# Patient Record
Sex: Female | Born: 1950 | Race: White | Hispanic: No | Marital: Married | Smoking: Former smoker
Health system: Southern US, Community
[De-identification: ages and names within clinical notes are randomized; demographics above are authoritative.]

## PROBLEM LIST (undated history)

## (undated) DIAGNOSIS — M069 Rheumatoid arthritis, unspecified: Secondary | ICD-10-CM

## (undated) DIAGNOSIS — M1990 Unspecified osteoarthritis, unspecified site: Secondary | ICD-10-CM

## (undated) DIAGNOSIS — E039 Hypothyroidism, unspecified: Secondary | ICD-10-CM

## (undated) DIAGNOSIS — I10 Essential (primary) hypertension: Secondary | ICD-10-CM

## (undated) DIAGNOSIS — E781 Pure hyperglyceridemia: Secondary | ICD-10-CM

## (undated) HISTORY — PX: OTHER SURGICAL HISTORY: SHX169

## (undated) HISTORY — PX: BREAST BIOPSY: SHX20

## (undated) HISTORY — PX: CHOLECYSTECTOMY: SHX55

---

## 1999-08-08 ENCOUNTER — Other Ambulatory Visit: Admission: RE | Admit: 1999-08-08 | Discharge: 1999-08-08 | Payer: Self-pay | Admitting: Gynecology

## 2000-07-07 ENCOUNTER — Encounter: Admission: RE | Admit: 2000-07-07 | Discharge: 2000-07-07 | Payer: Self-pay | Admitting: Gynecology

## 2000-07-07 ENCOUNTER — Encounter: Payer: Self-pay | Admitting: Gynecology

## 2000-07-21 ENCOUNTER — Other Ambulatory Visit: Admission: RE | Admit: 2000-07-21 | Discharge: 2000-07-21 | Payer: Self-pay | Admitting: Gynecology

## 2001-09-01 ENCOUNTER — Encounter: Payer: Self-pay | Admitting: Gynecology

## 2001-09-01 ENCOUNTER — Encounter: Admission: RE | Admit: 2001-09-01 | Discharge: 2001-09-01 | Payer: Self-pay | Admitting: Gynecology

## 2002-10-21 ENCOUNTER — Encounter: Payer: Self-pay | Admitting: Gynecology

## 2002-10-21 ENCOUNTER — Encounter: Admission: RE | Admit: 2002-10-21 | Discharge: 2002-10-21 | Payer: Self-pay | Admitting: Gynecology

## 2002-11-01 ENCOUNTER — Other Ambulatory Visit: Admission: RE | Admit: 2002-11-01 | Discharge: 2002-11-01 | Payer: Self-pay | Admitting: Gynecology

## 2003-12-19 ENCOUNTER — Encounter: Admission: RE | Admit: 2003-12-19 | Discharge: 2003-12-19 | Payer: Self-pay | Admitting: Gynecology

## 2003-12-20 ENCOUNTER — Other Ambulatory Visit: Admission: RE | Admit: 2003-12-20 | Discharge: 2003-12-20 | Payer: Self-pay | Admitting: Gynecology

## 2005-01-22 ENCOUNTER — Encounter: Admission: RE | Admit: 2005-01-22 | Discharge: 2005-01-22 | Payer: Self-pay | Admitting: Gynecology

## 2005-01-27 ENCOUNTER — Other Ambulatory Visit: Admission: RE | Admit: 2005-01-27 | Discharge: 2005-01-27 | Payer: Self-pay | Admitting: Gynecology

## 2006-03-13 ENCOUNTER — Encounter: Admission: RE | Admit: 2006-03-13 | Discharge: 2006-03-13 | Payer: Self-pay | Admitting: Gynecology

## 2006-03-25 ENCOUNTER — Other Ambulatory Visit: Admission: RE | Admit: 2006-03-25 | Discharge: 2006-03-25 | Payer: Self-pay | Admitting: Gynecology

## 2007-03-31 ENCOUNTER — Encounter: Admission: RE | Admit: 2007-03-31 | Discharge: 2007-03-31 | Payer: Self-pay | Admitting: Gynecology

## 2007-04-07 ENCOUNTER — Other Ambulatory Visit: Admission: RE | Admit: 2007-04-07 | Discharge: 2007-04-07 | Payer: Self-pay | Admitting: Gynecology

## 2008-04-28 ENCOUNTER — Encounter: Admission: RE | Admit: 2008-04-28 | Discharge: 2008-04-28 | Payer: Self-pay | Admitting: Gynecology

## 2009-05-24 ENCOUNTER — Encounter: Admission: RE | Admit: 2009-05-24 | Discharge: 2009-05-24 | Payer: Self-pay | Admitting: Gynecology

## 2009-06-11 ENCOUNTER — Emergency Department (HOSPITAL_BASED_OUTPATIENT_CLINIC_OR_DEPARTMENT_OTHER): Admission: EM | Admit: 2009-06-11 | Discharge: 2009-06-11 | Payer: Self-pay | Admitting: Emergency Medicine

## 2009-06-11 ENCOUNTER — Ambulatory Visit: Payer: Self-pay | Admitting: Diagnostic Radiology

## 2009-06-17 ENCOUNTER — Emergency Department (HOSPITAL_COMMUNITY): Admission: EM | Admit: 2009-06-17 | Discharge: 2009-06-17 | Payer: Self-pay | Admitting: Emergency Medicine

## 2009-07-18 ENCOUNTER — Ambulatory Visit (HOSPITAL_COMMUNITY): Admission: RE | Admit: 2009-07-18 | Discharge: 2009-07-18 | Payer: Self-pay | Admitting: General Surgery

## 2010-09-13 ENCOUNTER — Encounter: Admission: RE | Admit: 2010-09-13 | Discharge: 2010-09-13 | Payer: Self-pay | Admitting: Gynecology

## 2010-09-25 ENCOUNTER — Encounter
Admission: RE | Admit: 2010-09-25 | Discharge: 2010-09-25 | Payer: Self-pay | Source: Home / Self Care | Attending: Gynecology | Admitting: Gynecology

## 2010-09-27 ENCOUNTER — Encounter
Admission: RE | Admit: 2010-09-27 | Discharge: 2010-09-27 | Payer: Self-pay | Source: Home / Self Care | Attending: Gynecology | Admitting: Gynecology

## 2010-10-22 HISTORY — PX: BREAST BIOPSY: SHX20

## 2011-01-17 LAB — DIFFERENTIAL
Basophils Relative: 0 % (ref 0–1)
Eosinophils Absolute: 0 10*3/uL (ref 0.0–0.7)
Lymphocytes Relative: 18 % (ref 12–46)
Lymphs Abs: 1.2 10*3/uL (ref 0.7–4.0)
Monocytes Absolute: 0.4 10*3/uL (ref 0.1–1.0)
Monocytes Relative: 6 % (ref 3–12)
Neutro Abs: 5.3 10*3/uL (ref 1.7–7.7)

## 2011-01-17 LAB — URINE MICROSCOPIC-ADD ON

## 2011-01-17 LAB — URINALYSIS, ROUTINE W REFLEX MICROSCOPIC
Ketones, ur: NEGATIVE mg/dL
Nitrite: NEGATIVE
Protein, ur: NEGATIVE mg/dL
Urobilinogen, UA: 1 mg/dL (ref 0.0–1.0)

## 2011-01-17 LAB — COMPREHENSIVE METABOLIC PANEL
ALT: 429 U/L — ABNORMAL HIGH (ref 0–35)
BUN: 12 mg/dL (ref 6–23)
Calcium: 9.3 mg/dL (ref 8.4–10.5)
Creatinine, Ser: 0.77 mg/dL (ref 0.4–1.2)
GFR calc non Af Amer: >60 mL/min (ref >60)
Total Bilirubin: 2.3 mg/dL — ABNORMAL HIGH (ref 0.3–1.2)

## 2011-01-17 LAB — LIPASE, BLOOD: Lipase: 35 U/L (ref 11–59)

## 2011-01-17 LAB — CBC
HCT: 41.8 % (ref 36.0–46.0)
Hemoglobin: 14.3 g/dL (ref 12.0–15.0)
Platelets: 224 10*3/uL (ref 150–400)
RDW: 13.6 % (ref 11.5–15.5)
WBC: 7.1 10*3/uL (ref 4.0–10.5)

## 2011-01-18 LAB — POCT CARDIAC MARKERS
CKMB, poc: 1.1 ng/mL (ref 1.0–8.0)
Troponin i, poc: <0.05 ng/mL (ref 0.00–0.09)

## 2011-01-18 LAB — BASIC METABOLIC PANEL
Creatinine, Ser: 0.8 mg/dL (ref 0.4–1.2)
GFR calc non Af Amer: >60 mL/min (ref >60)

## 2011-01-18 LAB — CBC
HCT: 38.1 % (ref 36.0–46.0)
Hemoglobin: 13.4 g/dL (ref 12.0–15.0)
MCHC: 35 g/dL (ref 30.0–36.0)
Platelets: 232 10*3/uL (ref 150–400)
RDW: 12.8 % (ref 11.5–15.5)

## 2011-01-18 LAB — DIFFERENTIAL
Eosinophils Relative: 2 % (ref 0–5)
Monocytes Relative: 6 % (ref 3–12)
Neutro Abs: 4.2 10*3/uL (ref 1.7–7.7)
Neutrophils Relative %: 65 % (ref 43–77)

## 2011-08-27 ENCOUNTER — Other Ambulatory Visit: Payer: Self-pay | Admitting: Gynecology

## 2011-08-27 DIAGNOSIS — R921 Mammographic calcification found on diagnostic imaging of breast: Secondary | ICD-10-CM

## 2011-09-15 ENCOUNTER — Ambulatory Visit
Admission: RE | Admit: 2011-09-15 | Discharge: 2011-09-15 | Disposition: A | Discharge status: Home / Self Care | Payer: Commercial Indemnity | Source: Ambulatory Visit | Attending: Gynecology | Admitting: Gynecology

## 2011-09-15 DIAGNOSIS — R921 Mammographic calcification found on diagnostic imaging of breast: Secondary | ICD-10-CM

## 2011-10-02 ENCOUNTER — Other Ambulatory Visit: Payer: Self-pay | Admitting: Gynecology

## 2011-11-30 ENCOUNTER — Emergency Department (HOSPITAL_COMMUNITY): Payer: Commercial Indemnity

## 2011-11-30 ENCOUNTER — Encounter (HOSPITAL_COMMUNITY): Payer: Self-pay

## 2011-11-30 ENCOUNTER — Emergency Department (HOSPITAL_COMMUNITY)
Admission: EM | Admit: 2011-11-30 | Discharge: 2011-11-30 | Disposition: A | Discharge status: Home / Self Care | Payer: Commercial Indemnity | Attending: Emergency Medicine | Admitting: Emergency Medicine

## 2011-11-30 ENCOUNTER — Other Ambulatory Visit: Payer: Self-pay

## 2011-11-30 DIAGNOSIS — R110 Nausea: Secondary | ICD-10-CM | POA: Insufficient documentation

## 2011-11-30 DIAGNOSIS — R109 Unspecified abdominal pain: Secondary | ICD-10-CM | POA: Insufficient documentation

## 2011-11-30 DIAGNOSIS — M79609 Pain in unspecified limb: Secondary | ICD-10-CM | POA: Insufficient documentation

## 2011-11-30 DIAGNOSIS — R079 Chest pain, unspecified: Secondary | ICD-10-CM | POA: Insufficient documentation

## 2011-11-30 DIAGNOSIS — I10 Essential (primary) hypertension: Secondary | ICD-10-CM | POA: Insufficient documentation

## 2011-11-30 HISTORY — DX: Unspecified osteoarthritis, unspecified site: M19.90

## 2011-11-30 HISTORY — DX: Pure hyperglyceridemia: E78.1

## 2011-11-30 HISTORY — DX: Essential (primary) hypertension: I10

## 2011-11-30 LAB — DIFFERENTIAL
Basophils Relative: 0 % (ref 0–1)
Eosinophils Absolute: 0.1 10*3/uL (ref 0.0–0.7)
Eosinophils Relative: 2 % (ref 0–5)
Lymphs Abs: 1.6 10*3/uL (ref 0.7–4.0)
Neutrophils Relative %: 73 % (ref 43–77)

## 2011-11-30 LAB — CARDIAC PANEL(CRET KIN+CKTOT+MB+TROPI): CK, MB: 2.5 ng/mL (ref 0.3–4.0)

## 2011-11-30 LAB — BASIC METABOLIC PANEL
Calcium: 9.3 mg/dL (ref 8.4–10.5)
GFR calc Af Amer: >90 mL/min (ref >90)
GFR calc non Af Amer: 89 mL/min — ABNORMAL LOW (ref >90)
Glucose, Bld: 107 mg/dL — ABNORMAL HIGH (ref 70–99)
Sodium: 141 mEq/L (ref 135–145)

## 2011-11-30 LAB — LIPASE, BLOOD: Lipase: 56 U/L (ref 11–59)

## 2011-11-30 LAB — CBC
MCH: 29.6 pg (ref 26.0–34.0)
MCHC: 33.9 g/dL (ref 30.0–36.0)
MCV: 87.3 fL (ref 78.0–100.0)
Platelets: 197 10*3/uL (ref 150–400)
RBC: 4.02 MIL/uL (ref 3.87–5.11)
RDW: 13.5 % (ref 11.5–15.5)

## 2011-11-30 LAB — HEPATIC FUNCTION PANEL
AST: 24 U/L (ref 0–37)
Albumin: 3.3 g/dL — ABNORMAL LOW (ref 3.5–5.2)
Alkaline Phosphatase: 84 U/L (ref 39–117)
Bilirubin, Direct: <0.1 mg/dL (ref 0.0–0.3)
Total Bilirubin: 0.4 mg/dL (ref 0.3–1.2)

## 2011-11-30 MED ORDER — POTASSIUM CHLORIDE CRYS ER 20 MEQ PO TBCR
40.0000 meq | EXTENDED_RELEASE_TABLET | Freq: Once | ORAL | Status: AC
  Administered 2011-11-30: 40 meq via ORAL
  Filled 2011-11-30: qty 2

## 2011-11-30 MED ORDER — ASPIRIN 81 MG PO CHEW
324.0000 mg | CHEWABLE_TABLET | Freq: Once | ORAL | Status: AC
  Administered 2011-11-30: 324 mg via ORAL
  Filled 2011-11-30: qty 4

## 2011-11-30 NOTE — ED Provider Notes (Addendum)
History     CSN: [id number]  Arrival date & time 11/30/11  0701   First MD Initiated Contact with Patient 11/30/11 [phone]      Chief Complaint  Patient presents with  . Abdominal Pain    (Consider location/radiation/quality/duration/timing/severity/associated sxs/prior treatment) Patient is a 61 y.o. female presenting with chest pain. The history is provided by the patient.  Chest Pain The chest pain began 1 - 2 hours ago. Duration of episode(s) is 20 minutes. Chest pain occurs constantly. The chest pain is resolved. Associated with: No associated factors, onset at rest. The severity of the pain is moderate. The quality of the pain is described as dull and pressure-like (Localized to the bilateral lower anterior chest wall, radiating to the axillary). The pain does not radiate. Exacerbated by: Nothing. Primary symptoms include abdominal pain and nausea. Pertinent negatives for primary symptoms include no fever, no fatigue, no syncope, no shortness of breath, no cough, no wheezing, no palpitations, no vomiting, no dizziness and no altered mental status. Primary symptoms comment: The patient felt flushed  Pertinent negatives for associated symptoms include no claudication, no diaphoresis, no lower extremity edema, no near-syncope, no numbness, no orthopnea and no weakness. She tried nothing for the symptoms.  Her past medical history is significant for hyperlipidemia and hypertension.  Pertinent negatives for past medical history include no CAD, no CHF and no diabetes.     Past Medical History  Diagnosis Date  . Hypertension   . Arthritis   . High triglycerides     Past Surgical History  Procedure Date  . Cholecystectomy     History reviewed. No pertinent family history.  History  Substance Use Topics  . Smoking status: Former Smoker    Quit date: 11/30/2007  . Smokeless tobacco: Not on file  . Alcohol Use: No    OB History    Grav Para Term Preterm Abortions TAB SAB Ect  Mult Living                  Review of Systems  Constitutional: Negative for fever, diaphoresis and fatigue.  Respiratory: Negative for cough, shortness of breath and wheezing.   Cardiovascular: Positive for chest pain. Negative for palpitations, orthopnea, claudication, syncope and near-syncope.  Gastrointestinal: Positive for nausea and abdominal pain. Negative for vomiting.  Neurological: Negative for dizziness, weakness and numbness.  Psychiatric/Behavioral: Negative for altered mental status.  All other systems reviewed and are negative.    Allergies  Review of patient's allergies indicates no known allergies.  Home Medications   Current Outpatient Rx  Name Route Sig Dispense Refill  . ASPIRIN 81 MG PO CHEW Oral Chew 81 mg by mouth daily.    Marland Kitchen CALCIUM-VITAMIN D-VITAMIN K 500-100-40 MG-UNT-MCG PO CHEW Oral Chew 2 tablets by mouth daily.    Marland Kitchen VITAMIN D 1000 UNITS PO TABS Oral Take 1,000 Units by mouth daily.    . CYCLOSPORINE 0.05 % OP EMUL Both Eyes Place 1 drop into both eyes 2 (two) times daily.    Marland Kitchen EZETIMIBE-SIMVASTATIN 10-20 MG PO TABS Oral Take 1 tablet by mouth at bedtime.    Marland Kitchen FLUOROURACIL 1 % EX CREA Topical Apply 1 application topically 2 (two) times daily.    Marland Kitchen FOLIC ACID 1 MG PO TABS Oral Take 1 mg by mouth daily.    Marland Kitchen KRILL OIL OMEGA-3 300 MG PO CAPS Oral Take 1 capsule by mouth daily.    Marland Kitchen LEVOTHYROXINE SODIUM 137 MCG PO TABS Oral Take 137 mcg  by mouth daily.    Marland Kitchen LOSARTAN POTASSIUM-HCTZ 100-12.5 MG PO TABS Oral Take 1 tablet by mouth daily.    Marland Kitchen METHOTREXATE 2.5 MG PO TABS Oral Take 25 mg by mouth once a week. Caution:Chemotherapy. Protect from light. Takes on Wednesday    . OSTEO BI-FLEX ADV DOUBLE ST PO Oral Take 2 tablets by mouth daily.    . ADULT MULTIVITAMIN W/MINERALS CH Oral Take 1 tablet by mouth daily.    Marland Kitchen PHILLIPS COLON HEALTH PO Oral Take 1 tablet by mouth daily.    Marland Kitchen PSEUDOEPHEDRINE HCL ER 120 MG PO TB12 Oral Take 120 mg by mouth daily.      BP  136/71  Pulse 77  Temp(Src) 97.6 F (36.4 C) (Oral)  Resp 14  Ht 5' 4.75" (1.645 m)  Wt 148 lb (67.132 kg)  BMI 24.82 kg/m2  SpO2 100%  Physical Exam  Nursing note and vitals reviewed. Constitutional: She is oriented to person, place, and time. She appears well-developed and well-nourished. No distress.  HENT:  Head: Normocephalic and atraumatic.  Mouth/Throat: Oropharynx is clear and moist.  Eyes: EOM are normal. Pupils are equal, round, and reactive to light.  Neck: Normal range of motion. Neck supple. No JVD present. No tracheal deviation present.  Cardiovascular: Normal rate, regular rhythm, normal heart sounds and intact distal pulses.   No extrasystoles are present. Exam reveals no gallop, no S3, no S4 and no friction rub.   No murmur heard. Pulmonary/Chest: Breath sounds normal. No accessory muscle usage or stridor. Not tachypneic. No respiratory distress. She has no wheezes. She has no rales. She exhibits no tenderness.  Abdominal: Soft. Bowel sounds are normal. She exhibits no distension. There is no tenderness.  Musculoskeletal: Normal range of motion. She exhibits no edema and no tenderness.  Neurological: She is alert and oriented to person, place, and time. She has normal reflexes. No cranial nerve deficit. Coordination normal.  Skin: Skin is warm and dry. No rash noted. She is not diaphoretic. No erythema. No pallor.  Psychiatric: She has a normal mood and affect. Her behavior is normal. Judgment and thought content normal.    ED Course  Procedures (including critical care time)   Date: 11/30/2011  Rate: 80  Rhythm: normal sinus rhythm  QRS Axis: normal  Intervals: normal  ST/T Wave abnormalities: normal  Conduction Disutrbances:none  Narrative Interpretation: Normal EKG  Old EKG Reviewed: unchanged   Labs Reviewed  CBC - Abnormal; Notable for the following:    Hemoglobin 11.9 (*)    HCT 35.1 (*)    All other components within normal limits  BASIC  METABOLIC PANEL - Abnormal; Notable for the following:    Potassium 3.1 (*)    Glucose, Bld 107 (*)    GFR calc non Af Amer 89 (*)    All other components within normal limits  HEPATIC FUNCTION PANEL - Abnormal; Notable for the following:    Albumin 3.3 (*)    All other components within normal limits  DIFFERENTIAL  CARDIAC PANEL(CRET KIN+CKTOT+MB+TROPI)  LIPASE, BLOOD  TROPONIN I   Dg Chest 2 View  11/30/2011  *RADIOLOGY REPORT*  Clinical Data: Abdominal pain and chest pain  CHEST - 2 VIEW  Comparison: Chest radiograph 06/11/2009  Findings: Normal heart, mediastinal, and hilar contours.  Normal pulmonary vascularity.  The lungs are well expanded and clear. Right nipple shadow noted.  No acute bony abnormality. Cholecystectomy clips.  IMPRESSION: No acute cardiopulmonary disease.  Original Report Authenticated By: Britta Mccreedy,  M.D.     No diagnosis found.    MDM  ACS, MI, CAD, Musculoskeletal chest pain, costochondritis, GERD, Gastrointestinal Chest Pain, Pleuritic Chest Pain, Pneumonia, Pneumothorax, Pulmonary Embolism, Esophageal Spasm, Arrhythmia considered among other potential etiologies in the patient's differential diagnosis.  The patient's symptoms are atypical in that they started at rest and went away on their own without intervention, as well as the location of the symptoms. The patient describes her symptoms as feeling like pressure from the inside pushing this is also atypical, however as a woman she may have atypical symptoms with myocardial infarction. I think that this may more likely be diaphragmatic spasm, musculoskeletal chest wall pain, or gastrointestinal chest pain, not of a significant cause and now resolved, however I will rule the patient out for myocardial infarction in the emergency department with serial troponins.  12:02 PM Negative delta troponin, no further chest pain, I will discharge the patient home with atypical chest pain a followup with her primary  care physician.      Felisa Bonier, MD 11/30/11 0730  Felisa Bonier, MD 11/30/11 1478  Felisa Bonier, MD 11/30/11 [phone]

## 2011-11-30 NOTE — ED Notes (Signed)
Pt resting quietly. Denies pain. No signs of distress noted at the time. Vital signs stable.

## 2011-11-30 NOTE — ED Notes (Signed)
Pt is resting and denies any pain.

## 2011-11-30 NOTE — ED Notes (Signed)
Pt presents to department for evaluation of epigastric/chest pain radiating to back. Symptoms started this morning. Pt states she became sweaty when pain began, also states frequent burping. Nothing made pain better. Described as constant pressure sensation. Denies nausea/vomiting. No pain at the time. She is alert and oriented x4. No signs of distress noted at the time.

## 2011-11-30 NOTE — ED Notes (Signed)
Pt woke up about 1.5hrs ago with epigastric pain that radiates all the way around her. This resolved 20 mins after it started. #20 left hand. 12 lead per ems states that it was sinus.

## 2012-08-24 ENCOUNTER — Other Ambulatory Visit: Payer: Self-pay | Admitting: Gynecology

## 2012-08-24 DIAGNOSIS — Z1231 Encounter for screening mammogram for malignant neoplasm of breast: Secondary | ICD-10-CM

## 2012-10-01 ENCOUNTER — Ambulatory Visit
Admission: RE | Admit: 2012-10-01 | Discharge: 2012-10-01 | Disposition: A | Discharge status: Home / Self Care | Payer: Commercial Indemnity | Source: Ambulatory Visit | Attending: Gynecology | Admitting: Gynecology

## 2012-10-01 DIAGNOSIS — Z1231 Encounter for screening mammogram for malignant neoplasm of breast: Secondary | ICD-10-CM

## 2012-10-08 ENCOUNTER — Other Ambulatory Visit: Payer: Self-pay | Admitting: Gynecology

## 2013-08-31 ENCOUNTER — Other Ambulatory Visit: Payer: Self-pay

## 2013-08-31 DIAGNOSIS — Z1231 Encounter for screening mammogram for malignant neoplasm of breast: Secondary | ICD-10-CM

## 2013-10-05 ENCOUNTER — Ambulatory Visit: Payer: Commercial Indemnity

## 2013-10-14 ENCOUNTER — Ambulatory Visit
Admission: RE | Admit: 2013-10-14 | Discharge: 2013-10-14 | Disposition: A | Discharge status: Home / Self Care | Payer: BC Managed Care – PPO | Source: Ambulatory Visit

## 2013-10-14 DIAGNOSIS — Z1231 Encounter for screening mammogram for malignant neoplasm of breast: Secondary | ICD-10-CM

## 2014-02-22 ENCOUNTER — Other Ambulatory Visit: Payer: Self-pay | Admitting: Dermatology

## 2014-06-27 ENCOUNTER — Other Ambulatory Visit: Payer: Self-pay | Admitting: Dermatology

## 2014-09-18 ENCOUNTER — Other Ambulatory Visit: Payer: Self-pay

## 2014-09-18 DIAGNOSIS — Z1231 Encounter for screening mammogram for malignant neoplasm of breast: Secondary | ICD-10-CM

## 2014-10-18 ENCOUNTER — Ambulatory Visit
Admission: RE | Admit: 2014-10-18 | Discharge: 2014-10-18 | Disposition: A | Discharge status: Home / Self Care | Payer: Medicare Other | Source: Ambulatory Visit

## 2014-10-18 ENCOUNTER — Encounter (INDEPENDENT_AMBULATORY_CARE_PROVIDER_SITE_OTHER): Payer: Self-pay

## 2014-10-18 DIAGNOSIS — Z1231 Encounter for screening mammogram for malignant neoplasm of breast: Secondary | ICD-10-CM

## 2015-05-29 ENCOUNTER — Other Ambulatory Visit: Payer: Self-pay | Admitting: Family Medicine

## 2015-05-29 DIAGNOSIS — M5431 Sciatica, right side: Secondary | ICD-10-CM

## 2015-06-09 ENCOUNTER — Ambulatory Visit
Admission: RE | Admit: 2015-06-09 | Discharge: 2015-06-09 | Disposition: A | Discharge status: Home / Self Care | Payer: Medicare Other | Source: Ambulatory Visit | Attending: Family Medicine | Admitting: Family Medicine

## 2015-06-09 DIAGNOSIS — M5431 Sciatica, right side: Secondary | ICD-10-CM

## 2015-09-18 ENCOUNTER — Other Ambulatory Visit: Payer: Self-pay

## 2015-09-18 DIAGNOSIS — Z1231 Encounter for screening mammogram for malignant neoplasm of breast: Secondary | ICD-10-CM

## 2015-10-24 ENCOUNTER — Ambulatory Visit
Admission: RE | Admit: 2015-10-24 | Discharge: 2015-10-24 | Disposition: A | Discharge status: Home / Self Care | Payer: Medicare Other | Source: Ambulatory Visit

## 2015-10-24 DIAGNOSIS — Z1231 Encounter for screening mammogram for malignant neoplasm of breast: Secondary | ICD-10-CM

## 2015-10-25 ENCOUNTER — Other Ambulatory Visit: Payer: Self-pay | Admitting: Gynecology

## 2015-10-25 DIAGNOSIS — R928 Other abnormal and inconclusive findings on diagnostic imaging of breast: Secondary | ICD-10-CM

## 2015-10-30 ENCOUNTER — Ambulatory Visit
Admission: RE | Admit: 2015-10-30 | Discharge: 2015-10-30 | Disposition: A | Discharge status: Home / Self Care | Payer: Medicare Other | Source: Ambulatory Visit | Attending: Gynecology | Admitting: Gynecology

## 2015-10-30 DIAGNOSIS — R928 Other abnormal and inconclusive findings on diagnostic imaging of breast: Secondary | ICD-10-CM

## 2016-09-22 DIAGNOSIS — M5430 Sciatica, unspecified side: Secondary | ICD-10-CM | POA: Insufficient documentation

## 2016-09-22 DIAGNOSIS — I10 Essential (primary) hypertension: Secondary | ICD-10-CM | POA: Insufficient documentation

## 2016-09-22 DIAGNOSIS — E785 Hyperlipidemia, unspecified: Secondary | ICD-10-CM | POA: Insufficient documentation

## 2016-09-22 DIAGNOSIS — F5109 Other insomnia not due to a substance or known physiological condition: Secondary | ICD-10-CM | POA: Insufficient documentation

## 2016-09-22 DIAGNOSIS — M8580 Other specified disorders of bone density and structure, unspecified site: Secondary | ICD-10-CM | POA: Insufficient documentation

## 2016-09-22 DIAGNOSIS — R3129 Other microscopic hematuria: Secondary | ICD-10-CM | POA: Insufficient documentation

## 2016-09-22 DIAGNOSIS — R42 Dizziness and giddiness: Secondary | ICD-10-CM | POA: Insufficient documentation

## 2016-09-26 ENCOUNTER — Other Ambulatory Visit: Payer: Self-pay | Admitting: Gynecology

## 2016-09-26 DIAGNOSIS — Z1231 Encounter for screening mammogram for malignant neoplasm of breast: Secondary | ICD-10-CM

## 2016-09-28 DIAGNOSIS — E039 Hypothyroidism, unspecified: Secondary | ICD-10-CM | POA: Insufficient documentation

## 2016-10-17 ENCOUNTER — Other Ambulatory Visit: Payer: Self-pay | Admitting: Neurological Surgery

## 2016-11-07 ENCOUNTER — Ambulatory Visit
Admission: RE | Admit: 2016-11-07 | Discharge: 2016-11-07 | Disposition: A | Discharge status: Home / Self Care | Payer: Medicare Other | Source: Ambulatory Visit | Attending: Gynecology | Admitting: Gynecology

## 2016-11-07 DIAGNOSIS — Z1231 Encounter for screening mammogram for malignant neoplasm of breast: Secondary | ICD-10-CM

## 2016-11-13 ENCOUNTER — Encounter (HOSPITAL_COMMUNITY): Payer: Self-pay

## 2016-11-13 ENCOUNTER — Encounter (HOSPITAL_COMMUNITY)
Admission: RE | Admit: 2016-11-13 | Discharge: 2016-11-13 | Disposition: A | Discharge status: Home / Self Care | Payer: Medicare Other | Source: Ambulatory Visit | Attending: Neurological Surgery | Admitting: Neurological Surgery

## 2016-11-13 DIAGNOSIS — Z01810 Encounter for preprocedural cardiovascular examination: Secondary | ICD-10-CM | POA: Diagnosis present

## 2016-11-13 DIAGNOSIS — Z01812 Encounter for preprocedural laboratory examination: Secondary | ICD-10-CM | POA: Diagnosis present

## 2016-11-13 HISTORY — DX: Rheumatoid arthritis, unspecified: M06.9

## 2016-11-13 HISTORY — DX: Hypothyroidism, unspecified: E03.9

## 2016-11-13 LAB — BASIC METABOLIC PANEL
Anion gap: 11 (ref 5–15)
BUN: 12 mg/dL (ref 6–20)
CALCIUM: 9.8 mg/dL (ref 8.9–10.3)
CO2: 28 mmol/L (ref 22–32)
CREATININE: 0.97 mg/dL (ref 0.44–1.00)
Chloride: 101 mmol/L (ref 101–111)
GFR calc Af Amer: >60 mL/min (ref >60)
GFR, EST NON AFRICAN AMERICAN: 59 mL/min — AB (ref >60)
Glucose, Bld: 102 mg/dL — ABNORMAL HIGH (ref 65–99)
Potassium: 3.4 mmol/L — ABNORMAL LOW (ref 3.5–5.1)
SODIUM: 140 mmol/L (ref 135–145)

## 2016-11-13 LAB — TYPE AND SCREEN
ABO/RH(D): A NEG
ANTIBODY SCREEN: NEGATIVE

## 2016-11-13 LAB — SURGICAL PCR SCREEN
MRSA, PCR: NEGATIVE
Staphylococcus aureus: NEGATIVE

## 2016-11-13 LAB — CBC
HCT: 40.6 % (ref 36.0–46.0)
Hemoglobin: 13.8 g/dL (ref 12.0–15.0)
MCH: 29.7 pg (ref 26.0–34.0)
MCHC: 34 g/dL (ref 30.0–36.0)
MCV: 87.5 fL (ref 78.0–100.0)
PLATELETS: 209 10*3/uL (ref 150–400)
RBC: 4.64 MIL/uL (ref 3.87–5.11)
RDW: 13.8 % (ref 11.5–15.5)
WBC: 7.8 10*3/uL (ref 4.0–10.5)

## 2016-11-13 LAB — ABO/RH: ABO/RH(D): A NEG

## 2016-11-13 MED ORDER — CHLORHEXIDINE GLUCONATE CLOTH 2 % EX PADS: 6.0000 | MEDICATED_PAD | Freq: Once | CUTANEOUS | Status: DC

## 2016-11-13 NOTE — Progress Notes (Signed)
PCP: Kathryne Eriksson No cardiologist or heart history per pt.    EKG 11/13/16 Pt states she will stop taking Sudafed and Allegra-D. Last dose Allegra-D 11/13/16  Pt pulse 110-113 today, pt asymptomatic and states she is usually 90-100. Anesthesia notified.   Pt with no complaints of chest pain, SOB, palpitations, or signs of infection pet pt.

## 2016-11-13 NOTE — Pre-Procedure Instructions (Signed)
Allison Randall  11/13/2016      [id number] - [place], [place] - [address] [place] [address] Phone: [phone] Fax: [facility] [address] [place] - [id number] [place] [place] [address] Phone: [phone] Fax: [phone]    Your procedure is scheduled on Friday February 9.  Report to [facility] Admitting at 9:00 A.M.  Call this number if you have problems the morning of surgery:  [phone]   Remember:  Do not eat food or drink liquids after midnight.  Take these medicines the morning of surgery with A SIP OF WATER: levothyroxine (synthroid)  STOP taking pseudoephedrine (Alelgra-D) and Sudafed  7 days prior to surgery STOP taking any Aspirin, Aleve, Naproxen, Ibuprofen, Motrin, Advil, Goody's, BC's, all herbal medications, fish oil, and all vitamins    Do not wear jewelry, make-up or nail polish.  Do not wear lotions, powders, or perfumes, or deoderant.  Do not shave 48 hours prior to surgery.  Men may shave face and neck.  Do not bring valuables to the hospital.  [facility] is not responsible for any belongings or valuables.  Contacts, dentures or bridgework may not be worn into surgery.  Leave your suitcase in the car.  After surgery it may be brought to your room.  For patients admitted to the hospital, discharge time will be determined by your treatment team.  Patients discharged the day of surgery will not be allowed to drive home.    Special instructions:    [facility]- Preparing For Surgery  Before surgery, you can play an important role. Because skin is not sterile, your skin needs to be as free of germs as possible. You can reduce the number of germs on your skin by washing with CHG (chlorahexidine gluconate) Soap before surgery.  CHG is an antiseptic cleaner which kills germs and bonds with the skin to continue  killing germs even after washing.  Please do not use if you have an allergy to CHG or antibacterial soaps. If your skin becomes reddened/irritated stop using the CHG.  Do not shave (including legs and underarms) for at least 48 hours prior to first CHG shower. It is OK to shave your face.  Please follow these instructions carefully.   1. Shower the NIGHT BEFORE SURGERY and the MORNING OF SURGERY with CHG.   2. If you chose to wash your hair, wash your hair first as usual with your normal shampoo.  3. After you shampoo, rinse your hair and body thoroughly to remove the shampoo.  4. Use CHG as you would any other liquid soap. You can apply CHG directly to the skin and wash gently with a scrungie or a clean washcloth.   5. Apply the CHG Soap to your body ONLY FROM THE NECK DOWN.  Do not use on open wounds or open sores. Avoid contact with your eyes, ears, mouth and genitals (private parts). Wash genitals (private parts) with your normal soap.  6. Wash thoroughly, paying special attention to the area where your surgery will be performed.  7. Thoroughly rinse your body with warm water from the neck down.  8. DO NOT shower/wash with your normal soap after using and rinsing off the CHG Soap.  9. Pat yourself dry with a CLEAN TOWEL.   10. Wear CLEAN PAJAMAS   11. Place CLEAN SHEETS on your bed the  night of your first shower and DO NOT SLEEP WITH PETS.    Day of Surgery: Do not apply any deodorants/lotions. Please wear clean clothes to the hospital/surgery center.      Please read over the following fact sheets that you were given. MRSA Information

## 2016-11-14 NOTE — Progress Notes (Signed)
Anesthesia Chart Review:  Pt is a 66 year old female scheduled for L4-5 PLIF on 11/21/2016 with Kristeen Miss, MD.   PMH includes:  HTN, hypertriglyceridemia, hypothyroidism, RA.  Former smoker. BMI 26.  BP (!) 157/85   Pulse (!) 110   Temp 36.7 C   Resp 20   Ht 5\' 4"  (1.626 m)   Wt 149 lb 14.4 oz (68 kg)   SpO2 98%   BMI 25.73 kg/m    Medications include: Lipitor, Allegra-D, levothyroxine, losartan-hydrochlorothiazide, methotrexate, Sudafed. Pt instructed to stop all pseudo-ephedrine products at PAT on 11/13/16.   Preoperative labs reviewed.    EKG 11/13/16: Sinus tachycardia (109 bpm).    If no changes, I anticipate pt can proceed with surgery as scheduled.   Willeen Cass, FNP-BC [facility]Anesthesiology Phone: [phone] 11/14/2016 1:45 PM

## 2016-11-21 ENCOUNTER — Inpatient Hospital Stay (HOSPITAL_COMMUNITY)
Admission: RE | Admit: 2016-11-21 | Discharge: 2016-11-22 | DRG: 455 | Disposition: A | Discharge status: Home / Self Care | Payer: Medicare Other | Source: Ambulatory Visit | Attending: Neurological Surgery | Admitting: Neurological Surgery

## 2016-11-21 ENCOUNTER — Inpatient Hospital Stay (HOSPITAL_COMMUNITY): Payer: Medicare Other

## 2016-11-21 ENCOUNTER — Inpatient Hospital Stay (HOSPITAL_COMMUNITY): Payer: Medicare Other | Admitting: Certified Registered Nurse Anesthetist

## 2016-11-21 ENCOUNTER — Inpatient Hospital Stay (HOSPITAL_COMMUNITY): Payer: Medicare Other | Admitting: Emergency Medicine

## 2016-11-21 ENCOUNTER — Encounter (HOSPITAL_COMMUNITY): Payer: Self-pay | Admitting: Surgery

## 2016-11-21 ENCOUNTER — Encounter (HOSPITAL_COMMUNITY)
Admission: RE | Disposition: A | Discharge status: Home / Self Care | Payer: Self-pay | Source: Ambulatory Visit | Attending: Neurological Surgery

## 2016-11-21 DIAGNOSIS — I10 Essential (primary) hypertension: Secondary | ICD-10-CM | POA: Diagnosis present

## 2016-11-21 DIAGNOSIS — M4180 Other forms of scoliosis, site unspecified: Secondary | ICD-10-CM | POA: Diagnosis present

## 2016-11-21 DIAGNOSIS — M4316 Spondylolisthesis, lumbar region: Secondary | ICD-10-CM | POA: Diagnosis present

## 2016-11-21 DIAGNOSIS — Z87891 Personal history of nicotine dependence: Secondary | ICD-10-CM | POA: Diagnosis not present

## 2016-11-21 DIAGNOSIS — E039 Hypothyroidism, unspecified: Secondary | ICD-10-CM | POA: Diagnosis present

## 2016-11-21 DIAGNOSIS — Z79899 Other long term (current) drug therapy: Secondary | ICD-10-CM

## 2016-11-21 DIAGNOSIS — E781 Pure hyperglyceridemia: Secondary | ICD-10-CM | POA: Diagnosis present

## 2016-11-21 DIAGNOSIS — M069 Rheumatoid arthritis, unspecified: Secondary | ICD-10-CM | POA: Diagnosis present

## 2016-11-21 DIAGNOSIS — Z419 Encounter for procedure for purposes other than remedying health state, unspecified: Secondary | ICD-10-CM

## 2016-11-21 DIAGNOSIS — M7138 Other bursal cyst, other site: Secondary | ICD-10-CM | POA: Diagnosis present

## 2016-11-21 DIAGNOSIS — M7130 Other bursal cyst, unspecified site: Secondary | ICD-10-CM | POA: Diagnosis present

## 2016-11-21 DIAGNOSIS — M4726 Other spondylosis with radiculopathy, lumbar region: Secondary | ICD-10-CM | POA: Diagnosis present

## 2016-11-21 DIAGNOSIS — M48062 Spinal stenosis, lumbar region with neurogenic claudication: Principal | ICD-10-CM | POA: Diagnosis present

## 2016-11-21 SURGERY — POSTERIOR LUMBAR FUSION 1 LEVEL
Anesthesia: General | Site: Back

## 2016-11-21 MED ORDER — LORATADINE 10 MG PO TABS: 10.0000 mg | ORAL_TABLET | Freq: Every day | ORAL | Status: DC

## 2016-11-21 MED ORDER — PROMETHAZINE HCL 25 MG/ML IJ SOLN: 6.2500 mg | INTRAMUSCULAR | Status: DC | PRN

## 2016-11-21 MED ORDER — LIDOCAINE-EPINEPHRINE 1 %-1:100000 IJ SOLN
INTRAMUSCULAR | Status: DC | PRN
  Administered 2016-11-21: 5 mL

## 2016-11-21 MED ORDER — ONDANSETRON HCL 4 MG/2ML IJ SOLN
INTRAMUSCULAR | Status: AC
Start: 2016-11-21 — End: 2016-11-21
  Filled 2016-11-21: qty 2

## 2016-11-21 MED ORDER — HYDROMORPHONE HCL 1 MG/ML IJ SOLN: 0.5000 mg | INTRAMUSCULAR | Status: DC | PRN

## 2016-11-21 MED ORDER — SODIUM CHLORIDE 0.9% FLUSH: 3.0000 mL | INTRAVENOUS | Status: DC | PRN

## 2016-11-21 MED ORDER — OMEGA-3-ACID ETHYL ESTERS 1 G PO CAPS
1.0000 g | ORAL_CAPSULE | Freq: Every day | ORAL | Status: DC
  Administered 2016-11-22: 1 g via ORAL
  Filled 2016-11-21: qty 1

## 2016-11-21 MED ORDER — THROMBIN 5000 UNITS EX SOLR
CUTANEOUS | Status: AC
  Filled 2016-11-21: qty 5000

## 2016-11-21 MED ORDER — LIDOCAINE HCL (CARDIAC) 20 MG/ML IV SOLN
INTRAVENOUS | Status: DC | PRN
  Administered 2016-11-21: 60 mg via INTRAVENOUS

## 2016-11-21 MED ORDER — THROMBIN 20000 UNITS EX SOLR
CUTANEOUS | Status: AC
  Filled 2016-11-21: qty 20000

## 2016-11-21 MED ORDER — BISACODYL 10 MG RE SUPP: 10.0000 mg | Freq: Every day | RECTAL | Status: DC | PRN

## 2016-11-21 MED ORDER — MENTHOL 3 MG MT LOZG: 1.0000 | LOZENGE | OROMUCOSAL | Status: DC | PRN

## 2016-11-21 MED ORDER — DEXAMETHASONE SODIUM PHOSPHATE 4 MG/ML IJ SOLN
2.0000 mg | Freq: Two times a day (BID) | INTRAMUSCULAR | Status: DC
Start: 2016-11-21 — End: 2016-11-22

## 2016-11-21 MED ORDER — OXYCODONE-ACETAMINOPHEN 5-325 MG PO TABS
1.0000 | ORAL_TABLET | ORAL | Status: DC | PRN
  Administered 2016-11-21: 1 via ORAL
  Administered 2016-11-21 – 2016-11-22 (×3): 2 via ORAL
  Filled 2016-11-21: qty 1
  Filled 2016-11-21 (×3): qty 2

## 2016-11-21 MED ORDER — SENNA 8.6 MG PO TABS
1.0000 | ORAL_TABLET | Freq: Two times a day (BID) | ORAL | Status: DC
  Administered 2016-11-21 – 2016-11-22 (×2): 8.6 mg via ORAL
  Filled 2016-11-21 (×2): qty 1

## 2016-11-21 MED ORDER — THROMBIN 20000 UNITS EX SOLR
CUTANEOUS | Status: DC | PRN
  Administered 2016-11-21: 14:00:00 via TOPICAL

## 2016-11-21 MED ORDER — LIDOCAINE-EPINEPHRINE (PF) 2 %-1:200000 IJ SOLN
INTRAMUSCULAR | Status: AC
  Filled 2016-11-21: qty 20

## 2016-11-21 MED ORDER — PROPOFOL 10 MG/ML IV BOLUS
INTRAVENOUS | Status: DC | PRN
  Administered 2016-11-21: 150 mg via INTRAVENOUS

## 2016-11-21 MED ORDER — LEVOTHYROXINE SODIUM 112 MCG PO TABS
112.0000 ug | ORAL_TABLET | Freq: Every day | ORAL | Status: DC
  Administered 2016-11-22: 112 ug via ORAL
  Filled 2016-11-21: qty 1

## 2016-11-21 MED ORDER — LOSARTAN POTASSIUM-HCTZ 100-12.5 MG PO TABS: 1.0000 | ORAL_TABLET | Freq: Every day | ORAL | Status: DC

## 2016-11-21 MED ORDER — DEXAMETHASONE SODIUM PHOSPHATE 10 MG/ML IJ SOLN
INTRAMUSCULAR | Status: DC | PRN
Start: 2016-11-21 — End: 2016-11-21
  Administered 2016-11-21: 10 mg via INTRAVENOUS

## 2016-11-21 MED ORDER — ONDANSETRON HCL 4 MG/2ML IJ SOLN
INTRAMUSCULAR | Status: DC | PRN
  Administered 2016-11-21: 4 mg via INTRAVENOUS

## 2016-11-21 MED ORDER — ACETAMINOPHEN 325 MG PO TABS: 650.0000 mg | ORAL_TABLET | ORAL | Status: DC | PRN

## 2016-11-21 MED ORDER — FENTANYL CITRATE (PF) 100 MCG/2ML IJ SOLN
INTRAMUSCULAR | Status: AC
  Filled 2016-11-21: qty 4

## 2016-11-21 MED ORDER — ARTIFICIAL TEARS OP OINT
TOPICAL_OINTMENT | OPHTHALMIC | Status: DC | PRN
  Administered 2016-11-21: 1 via OPHTHALMIC

## 2016-11-21 MED ORDER — CEFAZOLIN SODIUM-DEXTROSE 2-4 GM/100ML-% IV SOLN
2.0000 g | Freq: Three times a day (TID) | INTRAVENOUS | Status: AC
  Administered 2016-11-21 – 2016-11-22 (×2): 2 g via INTRAVENOUS
  Filled 2016-11-21 (×2): qty 100

## 2016-11-21 MED ORDER — ARTIFICIAL TEARS OP OINT
TOPICAL_OINTMENT | OPHTHALMIC | Status: AC
  Filled 2016-11-21: qty 3.5

## 2016-11-21 MED ORDER — 0.9 % SODIUM CHLORIDE (POUR BTL) OPTIME
TOPICAL | Status: DC | PRN
  Administered 2016-11-21: 1000 mL

## 2016-11-21 MED ORDER — MIDAZOLAM HCL 5 MG/5ML IJ SOLN
INTRAMUSCULAR | Status: DC | PRN
  Administered 2016-11-21 (×2): 1 mg via INTRAVENOUS

## 2016-11-21 MED ORDER — LACTATED RINGERS IV SOLN
Freq: Once | INTRAVENOUS | Status: AC
  Administered 2016-11-21: 09:00:00 via INTRAVENOUS

## 2016-11-21 MED ORDER — FENTANYL CITRATE (PF) 100 MCG/2ML IJ SOLN
INTRAMUSCULAR | Status: AC
  Filled 2016-11-21: qty 2

## 2016-11-21 MED ORDER — THROMBIN 5000 UNITS EX SOLR
OROMUCOSAL | Status: DC | PRN
  Administered 2016-11-21: 14:00:00 via TOPICAL

## 2016-11-21 MED ORDER — CEFAZOLIN SODIUM-DEXTROSE 2-4 GM/100ML-% IV SOLN
2.0000 g | INTRAVENOUS | Status: AC
  Administered 2016-11-21: 2 g via INTRAVENOUS
  Filled 2016-11-21: qty 100

## 2016-11-21 MED ORDER — HYDROCHLOROTHIAZIDE 12.5 MG PO CAPS
12.5000 mg | ORAL_CAPSULE | Freq: Every day | ORAL | Status: DC
  Administered 2016-11-21 – 2016-11-22 (×2): 12.5 mg via ORAL
  Filled 2016-11-21 (×2): qty 1

## 2016-11-21 MED ORDER — ATORVASTATIN CALCIUM 20 MG PO TABS
20.0000 mg | ORAL_TABLET | Freq: Every day | ORAL | Status: DC
  Administered 2016-11-21: 20 mg via ORAL
  Filled 2016-11-21: qty 1

## 2016-11-21 MED ORDER — LIDOCAINE 2% (20 MG/ML) 5 ML SYRINGE
INTRAMUSCULAR | Status: AC
  Filled 2016-11-21: qty 5

## 2016-11-21 MED ORDER — ALBUMIN HUMAN 5 % IV SOLN
INTRAVENOUS | Status: DC | PRN
  Administered 2016-11-21: 15:00:00 via INTRAVENOUS

## 2016-11-21 MED ORDER — HYDROMORPHONE HCL 1 MG/ML IJ SOLN
INTRAMUSCULAR | Status: AC
  Administered 2016-11-21: 0.25 mg via INTRAVENOUS
  Filled 2016-11-21: qty 0.5

## 2016-11-21 MED ORDER — ALUM & MAG HYDROXIDE-SIMETH 200-200-20 MG/5ML PO SUSP: 30.0000 mL | Freq: Four times a day (QID) | ORAL | Status: DC | PRN

## 2016-11-21 MED ORDER — CYCLOSPORINE 0.05 % OP EMUL
1.0000 [drp] | Freq: Two times a day (BID) | OPHTHALMIC | Status: DC
  Administered 2016-11-21 – 2016-11-22 (×2): 1 [drp] via OPHTHALMIC
  Filled 2016-11-21 (×2): qty 1

## 2016-11-21 MED ORDER — METHOCARBAMOL 1000 MG/10ML IJ SOLN
500.0000 mg | Freq: Four times a day (QID) | INTRAMUSCULAR | Status: DC | PRN
  Filled 2016-11-21: qty 5

## 2016-11-21 MED ORDER — ROCURONIUM BROMIDE 100 MG/10ML IV SOLN
INTRAVENOUS | Status: DC | PRN
  Administered 2016-11-21: 50 mg via INTRAVENOUS

## 2016-11-21 MED ORDER — ZOLPIDEM TARTRATE 5 MG PO TABS: 2.5000 mg | ORAL_TABLET | Freq: Every evening | ORAL | Status: DC | PRN

## 2016-11-21 MED ORDER — PROPOFOL 10 MG/ML IV BOLUS
INTRAVENOUS | Status: AC
  Filled 2016-11-21: qty 20

## 2016-11-21 MED ORDER — HYDROMORPHONE HCL 1 MG/ML IJ SOLN
0.2500 mg | INTRAMUSCULAR | Status: DC | PRN
  Administered 2016-11-21: 0.25 mg via INTRAVENOUS

## 2016-11-21 MED ORDER — ONDANSETRON HCL 4 MG/2ML IJ SOLN: 4.0000 mg | INTRAMUSCULAR | Status: DC | PRN

## 2016-11-21 MED ORDER — MIDAZOLAM HCL 2 MG/2ML IJ SOLN
INTRAMUSCULAR | Status: AC
  Filled 2016-11-21: qty 2

## 2016-11-21 MED ORDER — BUPIVACAINE HCL (PF) 0.5 % IJ SOLN
INTRAMUSCULAR | Status: DC | PRN
  Administered 2016-11-21: 20 mL
  Administered 2016-11-21: 5 mL

## 2016-11-21 MED ORDER — ROCURONIUM BROMIDE 50 MG/5ML IV SOSY
PREFILLED_SYRINGE | INTRAVENOUS | Status: AC
  Filled 2016-11-21: qty 5

## 2016-11-21 MED ORDER — LOSARTAN POTASSIUM 50 MG PO TABS
100.0000 mg | ORAL_TABLET | Freq: Every day | ORAL | Status: DC
  Administered 2016-11-21 – 2016-11-22 (×2): 100 mg via ORAL
  Filled 2016-11-21 (×2): qty 2

## 2016-11-21 MED ORDER — DEXAMETHASONE 4 MG PO TABS
2.0000 mg | ORAL_TABLET | Freq: Two times a day (BID) | ORAL | Status: DC
  Administered 2016-11-21 – 2016-11-22 (×2): 2 mg via ORAL
  Filled 2016-11-21 (×2): qty 1

## 2016-11-21 MED ORDER — SODIUM CHLORIDE 0.9% FLUSH: 3.0000 mL | Freq: Two times a day (BID) | INTRAVENOUS | Status: DC

## 2016-11-21 MED ORDER — PSEUDOEPHEDRINE HCL ER 240 MG PO TB24
ORAL_TABLET | Freq: Every day | ORAL | Status: DC | PRN
Start: 2016-11-21 — End: 2016-11-21

## 2016-11-21 MED ORDER — PHENOL 1.4 % MT LIQD: 1.0000 | OROMUCOSAL | Status: DC | PRN

## 2016-11-21 MED ORDER — FLEET ENEMA 7-19 GM/118ML RE ENEM: 1.0000 | ENEMA | Freq: Once | RECTAL | Status: DC | PRN

## 2016-11-21 MED ORDER — DOCUSATE SODIUM 100 MG PO CAPS
100.0000 mg | ORAL_CAPSULE | Freq: Two times a day (BID) | ORAL | Status: DC
  Administered 2016-11-21 – 2016-11-22 (×2): 100 mg via ORAL
  Filled 2016-11-21 (×2): qty 1

## 2016-11-21 MED ORDER — FENTANYL CITRATE (PF) 100 MCG/2ML IJ SOLN
INTRAMUSCULAR | Status: DC | PRN
  Administered 2016-11-21 (×8): 50 ug via INTRAVENOUS

## 2016-11-21 MED ORDER — SODIUM CHLORIDE 0.9 % IV SOLN: 250.0000 mL | INTRAVENOUS | Status: DC

## 2016-11-21 MED ORDER — ACETAMINOPHEN 650 MG RE SUPP: 650.0000 mg | RECTAL | Status: DC | PRN

## 2016-11-21 MED ORDER — METHOCARBAMOL 500 MG PO TABS
500.0000 mg | ORAL_TABLET | Freq: Four times a day (QID) | ORAL | Status: DC | PRN
  Administered 2016-11-21 – 2016-11-22 (×2): 500 mg via ORAL
  Filled 2016-11-21 (×2): qty 1

## 2016-11-21 MED ORDER — SODIUM CHLORIDE 0.9 % IR SOLN
Status: DC | PRN
  Administered 2016-11-21: 14:00:00

## 2016-11-21 MED ORDER — LACTATED RINGERS IV SOLN
INTRAVENOUS | Status: DC | PRN
  Administered 2016-11-21 (×3): via INTRAVENOUS

## 2016-11-21 MED ORDER — SUGAMMADEX SODIUM 200 MG/2ML IV SOLN
INTRAVENOUS | Status: AC
  Filled 2016-11-21: qty 2

## 2016-11-21 MED ORDER — POLYETHYLENE GLYCOL 3350 17 G PO PACK: 17.0000 g | PACK | Freq: Every day | ORAL | Status: DC | PRN

## 2016-11-21 MED ORDER — SODIUM CHLORIDE 0.9 % IV SOLN
INTRAVENOUS | Status: DC
Start: 2016-11-21 — End: 2016-11-22

## 2016-11-21 MED ORDER — KRILL OIL OMEGA-3 300 MG PO CAPS: 1.0000 | ORAL_CAPSULE | Freq: Every day | ORAL | Status: DC

## 2016-11-21 SURGICAL SUPPLY — 71 items
ADH SKN CLS APL DERMABOND .7 (GAUZE/BANDAGES/DRESSINGS) ×1
APL SRG 60D 8 XTD TIP BNDBL (TIP)
BAG DECANTER FOR FLEXI CONT (MISCELLANEOUS) ×3 IMPLANT
BASKET BONE COLLECTION (BASKET) ×3 IMPLANT
BLADE CLIPPER SURG (BLADE) IMPLANT
BONE EQUIVA 10CC (Bone Implant) ×2 IMPLANT
BUR MATCHSTICK NEURO 3.0 LAGG (BURR) ×3 IMPLANT
CANISTER SUCT 3000ML PPV (MISCELLANEOUS) ×3 IMPLANT
CARTRIDGE OIL MAESTRO DRILL (MISCELLANEOUS) ×1 IMPLANT
CONT SPEC 4OZ CLIKSEAL STRL BL (MISCELLANEOUS) ×3 IMPLANT
COVER BACK TABLE 60X90IN (DRAPES) ×3 IMPLANT
DECANTER SPIKE VIAL GLASS SM (MISCELLANEOUS) ×3 IMPLANT
DERMABOND ADVANCED (GAUZE/BANDAGES/DRESSINGS) ×2
DERMABOND ADVANCED .7 DNX12 (GAUZE/BANDAGES/DRESSINGS) ×1 IMPLANT
DEVICE DISSECT PLASMABLAD 3.0S (MISCELLANEOUS) ×1 IMPLANT
DIFFUSER DRILL AIR PNEUMATIC (MISCELLANEOUS) ×3 IMPLANT
DRAPE C-ARM 42X72 X-RAY (DRAPES) ×6 IMPLANT
DRAPE HALF SHEET 40X57 (DRAPES) IMPLANT
DRAPE LAPAROTOMY 100X72X124 (DRAPES) ×3 IMPLANT
DRAPE POUCH INSTRU U-SHP 10X18 (DRAPES) ×3 IMPLANT
DRSG OPSITE POSTOP 4X6 (GAUZE/BANDAGES/DRESSINGS) ×2 IMPLANT
DURAPREP 26ML APPLICATOR (WOUND CARE) ×3 IMPLANT
DURASEAL APPLICATOR TIP (TIP) IMPLANT
DURASEAL SPINE SEALANT 3ML (MISCELLANEOUS) IMPLANT
ELECT REM PT RETURN 9FT ADLT (ELECTROSURGICAL) ×3
ELECTRODE REM PT RTRN 9FT ADLT (ELECTROSURGICAL) ×1 IMPLANT
GAUZE SPONGE 4X4 12PLY STRL (GAUZE/BANDAGES/DRESSINGS) ×1 IMPLANT
GAUZE SPONGE 4X4 16PLY XRAY LF (GAUZE/BANDAGES/DRESSINGS) IMPLANT
GLOVE BIO SURGEON STRL SZ 6.5 (GLOVE) ×3 IMPLANT
GLOVE BIO SURGEONS STRL SZ 6.5 (GLOVE) ×3
GLOVE BIOGEL PI IND STRL 6.5 (GLOVE) IMPLANT
GLOVE BIOGEL PI IND STRL 7.5 (GLOVE) IMPLANT
GLOVE BIOGEL PI IND STRL 8.5 (GLOVE) ×2 IMPLANT
GLOVE BIOGEL PI INDICATOR 6.5 (GLOVE) ×2
GLOVE BIOGEL PI INDICATOR 7.5 (GLOVE) ×2
GLOVE BIOGEL PI INDICATOR 8.5 (GLOVE) ×4
GLOVE ECLIPSE 8.5 STRL (GLOVE) ×8 IMPLANT
GLOVE SS BIOGEL STRL SZ 7.5 (GLOVE) IMPLANT
GLOVE SUPERSENSE BIOGEL SZ 7.5 (GLOVE) ×2
GLOVE SURG SS PI 7.0 STRL IVOR (GLOVE) ×14 IMPLANT
GOWN STRL REUS W/ TWL LRG LVL3 (GOWN DISPOSABLE) IMPLANT
GOWN STRL REUS W/ TWL XL LVL3 (GOWN DISPOSABLE) IMPLANT
GOWN STRL REUS W/TWL 2XL LVL3 (GOWN DISPOSABLE) ×6 IMPLANT
GOWN STRL REUS W/TWL LRG LVL3 (GOWN DISPOSABLE) ×15
GOWN STRL REUS W/TWL XL LVL3 (GOWN DISPOSABLE)
HEMOSTAT POWDER KIT SURGIFOAM (HEMOSTASIS) ×2 IMPLANT
KIT BASIN OR (CUSTOM PROCEDURE TRAY) ×3 IMPLANT
KIT ROOM TURNOVER OR (KITS) ×3 IMPLANT
NEEDLE HYPO 22GX1.5 SAFETY (NEEDLE) ×3 IMPLANT
NS IRRIG 1000ML POUR BTL (IV SOLUTION) ×3 IMPLANT
OIL CARTRIDGE MAESTRO DRILL (MISCELLANEOUS) ×3
PACK LAMINECTOMY NEURO (CUSTOM PROCEDURE TRAY) ×3 IMPLANT
PAD ARMBOARD 7.5X6 YLW CONV (MISCELLANEOUS) ×9 IMPLANT
PATTIES SURGICAL .5 X1 (DISPOSABLE) ×1 IMPLANT
PLASMABLADE 3.0S (MISCELLANEOUS) ×3
ROD TI ALLOY CVD VIT 5.5X35MM (Rod) ×4 IMPLANT
SCREW VITALITY PA 6.5X45MM (Screw) ×8 IMPLANT
SPACER LORDOTIC 9HX25LX10W 8 (Spacer) ×1 IMPLANT
SPONGE LAP 4X18 X RAY DECT (DISPOSABLE) IMPLANT
SPONGE SURGIFOAM ABS GEL 100 (HEMOSTASIS) ×3 IMPLANT
SUT PROLENE 6 0 BV (SUTURE) IMPLANT
SUT VIC AB 1 CT1 18XBRD ANBCTR (SUTURE) ×1 IMPLANT
SUT VIC AB 1 CT1 8-18 (SUTURE) ×6
SUT VIC AB 2-0 CP2 18 (SUTURE) ×5 IMPLANT
SUT VIC AB 3-0 SH 8-18 (SUTURE) ×5 IMPLANT
SYR 3ML LL SCALE MARK (SYRINGE) ×12 IMPLANT
TOP CLOSURE TORQ LIMIT (Neuro Prosthesis/Implant) ×8 IMPLANT
TOWEL OR 17X24 6PK STRL BLUE (TOWEL DISPOSABLE) ×3 IMPLANT
TOWEL OR 17X26 10 PK STRL BLUE (TOWEL DISPOSABLE) ×3 IMPLANT
TRAY FOLEY W/METER SILVER 16FR (SET/KITS/TRAYS/PACK) ×3 IMPLANT
WATER STERILE IRR 1000ML POUR (IV SOLUTION) ×3 IMPLANT

## 2016-11-21 NOTE — Anesthesia Preprocedure Evaluation (Signed)
Anesthesia Evaluation  Patient identified by MRN, date of birth, ID band Patient awake    Reviewed: Allergy & Precautions, NPO status , Patient's Chart, lab work & pertinent test results  Airway Mallampati: II  TM Distance: >3 FB Neck ROM: Full    Dental no notable dental hx.    Pulmonary neg pulmonary ROS, former smoker,    Pulmonary exam normal breath sounds clear to auscultation       Cardiovascular hypertension, Normal cardiovascular exam Rhythm:Regular Rate:Normal     Neuro/Psych negative neurological ROS  negative psych ROS   GI/Hepatic negative GI ROS, Neg liver ROS,   Endo/Other  Hypothyroidism   Renal/GU negative Renal ROS  negative genitourinary   Musculoskeletal  (+) Arthritis , Rheumatoid disorders,    Abdominal   Peds negative pediatric ROS (+)  Hematology negative hematology ROS (+)   Anesthesia Other Findings   Reproductive/Obstetrics negative OB ROS                             Anesthesia Physical Anesthesia Plan  ASA: III  Anesthesia Plan: General   Post-op Pain Management:    Induction: Intravenous  Airway Management Planned: Oral ETT  Additional Equipment:   Intra-op Plan:   Post-operative Plan: Extubation in OR  Informed Consent: I have reviewed the patients History and Physical, chart, labs and discussed the procedure including the risks, benefits and alternatives for the proposed anesthesia with the patient or authorized representative who has indicated his/her understanding and acceptance.   Dental advisory given  Plan Discussed with: CRNA and Surgeon  Anesthesia Plan Comments:         Anesthesia Quick Evaluation

## 2016-11-21 NOTE — Progress Notes (Signed)
Orthopedic Tech Progress Note Patient Details:  Allison Randall June 19, 1951 [id number] Patient was a pre-fit Patient ID: [name], female   DOB: [birth year], 66 y.o.   MRN: [id number]   [name] 11/21/2016, 7:28 PM

## 2016-11-21 NOTE — Transfer of Care (Signed)
Immediate Anesthesia Transfer of Care Note  Patient: Allison Randall  Procedure(s) Performed: Procedure(s): Lumbar four-five Posterior Lumbar Interbody fusion (N/A)  Patient Location: PACU  Anesthesia Type:General  Level of Consciousness: awake, alert , oriented and patient cooperative  Airway & Oxygen Therapy: Patient Spontanous Breathing and Patient connected to nasal cannula oxygen  Post-op Assessment: Report given to RN, Post -op Vital signs reviewed and stable, Patient moving all extremities and Patient moving all extremities X 4  Post vital signs: Reviewed and stable  Last Vitals:  Vitals:   11/21/16 0822  BP: (!) 166/79  Pulse: 89  Resp: 20  Temp: 37.2 C    Last Pain:  Vitals:   11/21/16 0822  TempSrc: Oral      Patients Stated Pain Goal: 4 ([id number])  Complications: No apparent anesthesia complications

## 2016-11-21 NOTE — Anesthesia Procedure Notes (Signed)
Procedure Name: Intubation Date/Time: 11/21/2016 12:56 PM Performed by: Scheryl Darter Pre-anesthesia Checklist: Patient identified, Emergency Drugs available, Suction available and Patient being monitored Patient Re-evaluated:Patient Re-evaluated prior to inductionOxygen Delivery Method: Circle System Utilized Preoxygenation: Pre-oxygenation with 100% oxygen Intubation Type: IV induction Ventilation: Mask ventilation without difficulty Tube type: Oral Tube size: 7.5 mm Number of attempts: 1 Airway Equipment and Method: Stylet and Oral airway Placement Confirmation: ETT inserted through vocal cords under direct vision,  positive ETCO2 and breath sounds checked- equal and bilateral Secured at: 22 cm Tube secured with: Tape Dental Injury: Teeth and Oropharynx as per pre-operative assessment

## 2016-11-21 NOTE — H&P (Signed)
Allison Randall is an 66 y.o. female.   Chief Complaint: Back and bilateral lower extremity pain HPI: Patient is a 66 year old individual who's had a significant spondylolisthesis at the L4-L5 level. She is been treated conservatively for a number of years by me and others with chiropractic manipulation exercise programs and occasional intermittent epidural steroid injections. After her last injection her pain became refractory and she notes that she's had bilateral dysesthesias into the lower extremities and he has had some weakness on the tibialis anterior group. She's been advised regarding surgical decompression and stabilization at the level of L4-L5. She is now admitted for this procedure.  Past Medical History:  Diagnosis Date  . Arthritis   . High triglycerides   . Hypertension   . Hypothyroidism   . Rheumatoid arthritis [facility])     Past Surgical History:  Procedure Laterality Date  . CHOLECYSTECTOMY    . eyelid lift    . teeth implants     bottom, with screws    History reviewed. No pertinent family history. Social History:  reports that she quit smoking about 8 years ago. She has never used smokeless tobacco. She reports that she does not drink alcohol or use drugs.  Allergies:  Allergies  Allergen Reactions  . No Known Allergies     Medications Prior to Admission  Medication Sig Dispense Refill  . atorvastatin (LIPITOR) 20 MG tablet Take 20 mg by mouth daily at 6 PM.    . Calcium-Vitamin D-Vitamin K (VIACTIV) [id number] MG-UNT-MCG CHEW Chew 2 tablets by mouth daily.    . cholecalciferol (VITAMIN D) 1000 UNITS tablet Take 1,000 Units by mouth daily.    . cycloSPORINE (RESTASIS) 0.05 % ophthalmic emulsion Place 1 drop into both eyes 2 (two) times daily.    . fexofenadine-pseudoephedrine (ALLEGRA-D 24) 180-240 MG 24 hr tablet Take 1 tablet by mouth daily.    . folic acid (FOLVITE) 1 MG tablet Take 1 mg by mouth 2 (two) times daily.     Astrid Drafts Omega-3 300 MG CAPS Take 1  capsule by mouth daily.    Marland Kitchen levothyroxine (SYNTHROID, LEVOTHROID) 112 MCG tablet Take 112 mcg by mouth daily before breakfast.    . losartan-hydrochlorothiazide (HYZAAR) 100-12.5 MG per tablet Take 1 tablet by mouth daily.    Marland Kitchen Lysine 1000 MG TABS Take 1,000 mg by mouth 2 (two) times daily.    . methotrexate (RHEUMATREX) 2.5 MG tablet Take 25 mg by mouth once a week. Takes 10 tablets Caution:Chemotherapy. Protect from light. Takes on Wednesday    . Multiple Vitamin (MULITIVITAMIN WITH MINERALS) TABS Take 1 tablet by mouth daily.    . naproxen sodium (ANAPROX) 220 MG tablet Take 220 mg by mouth 2 (two) times daily as needed (pain).    . Probiotic Product (PROBIOTIC PO) Take 1 tablet by mouth daily.    . Pseudoephedrine HCl (SUDAFED 24 HOUR PO) Take 1 tablet by mouth daily as needed (sinus pressure).    . valACYclovir (VALTREX) 500 MG tablet Take 2,000 mg by mouth 2 (two) times daily as needed (cold sores).     . zolpidem (AMBIEN) 10 MG tablet Take 2.5 mg by mouth at bedtime as needed for sleep.      No results found for this or any previous visit (from the past 48 hour(s)). No results found.  Review of Systems  HENT: Negative.   Eyes: Negative.   Respiratory: Negative.   Cardiovascular: Negative.   Gastrointestinal: Negative.   Genitourinary: Negative.  Musculoskeletal: Positive for back pain.  Skin: Negative.   Neurological: Positive for sensory change and weakness.  Endo/Heme/Allergies: Negative.   Psychiatric/Behavioral: Negative.     Blood pressure (!) 166/79, pulse 89, temperature 98.9 F (37.2 C), temperature source Oral, resp. rate 20, height 5\' 4"  (1.626 m), weight 68 kg (149 lb 14 oz), SpO2 99 %. Physical Exam  Constitutional: She is oriented to person, place, and time. She appears well-developed and well-nourished.  HENT:  Head: Normocephalic and atraumatic.  Eyes: Conjunctivae and EOM are normal. Pupils are equal, round, and reactive to light.  Neck: Normal range of  motion. Neck supple.  Cardiovascular: Regular rhythm and normal heart sounds.   Respiratory: Effort normal and breath sounds normal.  GI: Soft. Bowel sounds are normal.  Musculoskeletal: Normal range of motion.  Neurological: She is alert and oriented to person, place, and time.  Cranial nerve examination is normal. Motor function of the upper extremities is normal. Station and gait are intact. Significant tenderness to deep palpation and percussion of the lumbosacral junction. Straight leg raising is positive at 15 in either lower extremity. Patrick's maneuver is negative. Weakness in the tibialis anterior mild 4+ out of 5 bilaterally. Sensation only slightly diminished in dorsum of both feet.     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  Plan: Patient is to be admitted to undergo surgical decompression stabilization at L4-L5 via a posterior lumbar interbody technique.  Earleen Newport, MD 11/21/2016, 12:38 PM

## 2016-11-21 NOTE — Op Note (Signed)
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  Asst.: Cyndy Freeze M.D.  Indications: Patient is Allison Randall is a 66 y.o. female who who's had significant back pain and lumbar radiculopathy for over a years period time. A lumbar myelogram demonstrates advanced spondylolisthesis with high-grade canal stenosis. she was advised regarding surgical intervention.  Procedure: The patient was brought to the operating room supine on a stretcher. After the smooth induction of general endotracheal anesthesia she was turned prone and the back was prepped with alcohol and DuraPrep. The back was then draped sterilely. A midline incision was created and carried down to the lumbar dorsal fascia. A localizing radiograph identified the L4 and L5 spinous processes. A subligamentous dissection was created at L4 and L5 to expose the interlaminar space at L4 and L5 and the facet joints over the L4-L5 interspace. Laminotomies were were then created removing the entire inferior margin of the lamina of L4 including the inferior facet at the L4-L5 joint. The yellow ligament was taken up and the common dural tube was exposed along with the L4 nerve root superiorly, and the L5 nerve root inferiorly, the disc space was exposed and epidural veins in this region were cauterized and divided. The L4 nerve roots and the L5 nerve root were dissected with care taken to protect them. The disc space was opened and a combination of curettes and rongeurs was used to evacuate the disc space fully. The e ndplates were  removed using sharp curettes. An interbody spacer was placed to distract the disc space while the contralateral discectomy was performed. When the entirety of the disc was removed and the endplates were prepared final sizing of the disc space was obtained 9 mm peek spacer with a degrees of lordosis was chosen for the right side where the scoliosis closed the interspace was chosen and packed with autograft and allograft and placed into the interspace only on the right side. The remainder of the interspace was packed with autograft and allograft.  At this time the laminotomy and foraminotomy was created at L5-S1 on the right side with there is a large synovial cyst causing compression superiorly of the L5 nerve root inferiorly of the S1 nerve root.   Pedicle entry sites were then chosen using fluoroscopic guidance and 6.5 x 45 mm screws were placed in L4 and 6.5 x 45 mm screws were placed in L5. The lateral gutters were decorticated and graft was packed in the posterolateral gutters between L4 and L5. Final radiographs were obtained after placing appropriately sized rods between the pedicle screws at L4-L5 and torquing these to the appropriate tension. The surgical site was inspected carefully to assure the L4 and L5 nerve roots were well decompressed, hemostasis was obtained, and the graft was well packed. Then the retractors were removed and the wound was closed with #1 Vicryl in the lumbar dorsal fascia 2-0 Vicryl in the subcutaneous tissue and 3-0 Vicryl subcuticularly. When he cc of half percent Marcaine was injected into the paraspinous musculature at the time of closure. Blood loss was estimated at 300 cc. The patient tolerated procedure well and was returned to the recovery room in stable condition.

## 2016-11-22 LAB — CBC
HEMATOCRIT: 31.4 % — AB (ref 36.0–46.0)
Hemoglobin: 10.7 g/dL — ABNORMAL LOW (ref 12.0–15.0)
MCH: 29.5 pg (ref 26.0–34.0)
MCHC: 34.1 g/dL (ref 30.0–36.0)
MCV: 86.5 fL (ref 78.0–100.0)
PLATELETS: 185 10*3/uL (ref 150–400)
RBC: 3.63 MIL/uL — AB (ref 3.87–5.11)
RDW: 13.7 % (ref 11.5–15.5)
WBC: 16.1 10*3/uL — AB (ref 4.0–10.5)

## 2016-11-22 LAB — BASIC METABOLIC PANEL
ANION GAP: 15 (ref 5–15)
BUN: 13 mg/dL (ref 6–20)
CO2: 24 mmol/L (ref 22–32)
Calcium: 8.4 mg/dL — ABNORMAL LOW (ref 8.9–10.3)
Chloride: 98 mmol/L — ABNORMAL LOW (ref 101–111)
Creatinine, Ser: 0.93 mg/dL (ref 0.44–1.00)
GLUCOSE: 202 mg/dL — AB (ref 65–99)
POTASSIUM: 3.1 mmol/L — AB (ref 3.5–5.1)
Sodium: 137 mmol/L (ref 135–145)

## 2016-11-22 MED ORDER — OXYCODONE-ACETAMINOPHEN 5-325 MG PO TABS: 1.0000 | ORAL_TABLET | ORAL | 0 refills | Status: DC | PRN

## 2016-11-22 MED ORDER — METHOCARBAMOL 500 MG PO TABS: 500.0000 mg | ORAL_TABLET | Freq: Four times a day (QID) | ORAL | 0 refills | Status: AC | PRN

## 2016-11-22 NOTE — Progress Notes (Signed)
Pt doing well. Pt given D/C instructions with Rx's, verbal understanding was provided. Pt's incision is clean and dry with no sign of infection. Pt's IV was removed prior to D/C. Pt D/C'd home via wheelchair @ 1130 per MD order. Pt is stable @ D/C and has no other needs at this time. Reginae Wolfrey, RN  

## 2016-11-22 NOTE — Anesthesia Postprocedure Evaluation (Addendum)
Anesthesia Post Note  Patient: Allison Randall  Procedure(s) Performed: Procedure(s) (LRB): Lumbar four-five Posterior Lumbar Interbody fusion (N/A)  Patient location during evaluation: PACU Anesthesia Type: General Level of consciousness: awake and alert Pain management: pain level controlled Vital Signs Assessment: post-procedure vital signs reviewed and stable Respiratory status: spontaneous breathing, nonlabored ventilation, respiratory function stable and patient connected to nasal cannula oxygen Cardiovascular status: blood pressure returned to baseline and stable Postop Assessment: no signs of nausea or vomiting Anesthetic complications: no       Last Vitals:  Vitals:   11/21/16 2339 11/22/16 0308  BP: (!) 148/74 (!) 116/55  Pulse: 98 81  Resp: 18 18  Temp: 36.4 C 36.8 C    Last Pain:  Vitals:   11/22/16 0632  TempSrc:   PainSc: 3                  Sihaam Chrobak S

## 2016-11-22 NOTE — Discharge Instructions (Signed)

## 2016-11-22 NOTE — Evaluation (Signed)
Physical Therapy Evaluation Patient Details Name: Allison Randall MRN: [id number] DOB: 01-27-1951 Today's Date: 11/22/2016   History of Present Illness  Patient is a 66 yo female s/p Lumbar four-five Posterior Lumbar Interbody fusion.  Clinical Impression  Patient seen for mobility assessment and education s/p spinal surgery. Mobilizing well, educated on precautions, mobility expectations and car transfers. No further acute PT needs at htis time. Will sign off.    Follow Up Recommendations No PT follow up;Supervision - Intermittent    Equipment Recommendations  None recommended by PT    Recommendations for Other Services       Precautions / Restrictions Precautions Precautions: Back Precaution Booklet Issued: Yes (comment) Required Braces or Orthoses: Spinal Brace Spinal Brace: Lumbar corset      Mobility  Bed Mobility Overal bed mobility: Modified Independent             General bed mobility comments: no physical assist required  Transfers Overall transfer level: Modified independent               General transfer comment: increased time to perform, no cues or assist required  Ambulation/Gait Ambulation/Gait assistance: Independent Ambulation Distance (Feet): 380 Feet Assistive device: None Gait Pattern/deviations: WFL(Within Functional Limits)   Gait velocity interpretation: at or above normal speed for age/gender General Gait Details: steady with ambulation  Stairs Stairs: Yes Stairs assistance: Supervision Stair Management: One rail Right Number of Stairs: 6 General stair comments: Vcs for sequencing and technique  Wheelchair Mobility    Modified Rankin (Stroke Patients Only)       Balance Overall balance assessment: No apparent balance deficits (not formally assessed)                                           Pertinent Vitals/Pain      Home Living Family/patient expects to be discharged to:: Private  residence Living Arrangements: Spouse/significant other Available Help at Discharge: Family Type of Home: House Home Access: Stairs to enter Entrance Stairs-Rails: Right Entrance Stairs-Number of Steps: 4 Home Layout: One level Home Equipment: Cane - single point      Prior Function Level of Independence: Independent               Hand Dominance   Dominant Hand: Right    Extremity/Trunk Assessment   Upper Extremity Assessment Upper Extremity Assessment: Overall WFL for tasks assessed    Lower Extremity Assessment Lower Extremity Assessment: Overall WFL for tasks assessed       Communication      Cognition Arousal/Alertness: Awake/alert Behavior During Therapy: WFL for tasks assessed/performed Overall Cognitive Status: Within Functional Limits for tasks assessed                      General Comments      Exercises     Assessment/Plan    PT Assessment Patent does not need any further PT services  PT Problem List            PT Treatment Interventions      PT Goals (Current goals can be found in the Care Plan section)  Acute Rehab PT Goals PT Goal Formulation: All assessment and education complete, DC therapy    Frequency     Barriers to discharge        Co-evaluation  End of Session Equipment Utilized During Treatment: Back brace Activity Tolerance: Patient tolerated treatment well Patient left: in bed (sitting EOB) Nurse Communication: Mobility status    Functional Assessment Tool Used: clinical judgement Functional Limitation: Mobility: Walking and moving around Mobility: Walking and Moving Around Current Status [id number]): At least 1 percent but less than 20 percent impaired, limited or restricted Mobility: Walking and Moving Around Goal Status [phone]): At least 1 percent but less than 20 percent impaired, limited or restricted Mobility: Walking and Moving Around Discharge Status [phone]): At least 1 percent but  less than 20 percent impaired, limited or restricted    Time: 0743-0801 PT Time Calculation (min) (ACUTE ONLY): 18 min   Charges:   PT Evaluation $PT Eval Low Complexity: 1 Procedure     PT G Codes:   PT G-Codes **NOT FOR INPATIENT CLASS** Functional Assessment Tool Used: clinical judgement Functional Limitation: Mobility: Walking and moving around Mobility: Walking and Moving Around Current Status [id number]): At least 1 percent but less than 20 percent impaired, limited or restricted Mobility: Walking and Moving Around Goal Status [phone]): At least 1 percent but less than 20 percent impaired, limited or restricted Mobility: Walking and Moving Around Discharge Status [phone]): At least 1 percent but less than 20 percent impaired, limited or restricted    Duncan Dull 11/22/2016, 8:26 AM Alben Deeds, PT DPT  [phone]

## 2016-11-22 NOTE — Discharge Summary (Signed)
Physician Discharge Summary  Patient ID: Allison Randall MRN: [id number] DOB/AGE: 68-26-1952 68 y.o.  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 Ellene Route who performed an L4-5 lumbar laminectomy, PLIF, and PLA. She is done well following surgery. She is up and ambulating in the halls. She is asking be discharged to home. She's been given instructions regarding wound care and activities following discharge. She is to follow-up with Dr. Ellene Route in about 3 weeks or so.  Discharge Exam: Blood pressure 119/61, pulse 91, temperature 97.8 F (36.6 C), temperature source Oral, resp. rate 18, height 5\' 4"  (1.626 m), weight 68 kg (149 lb 14 oz), SpO2 91 %.  Disposition: 01-Home or Self Care  Discharge Instructions    Discharge wound care:    Complete by:  As directed    Leave the wound open to air. Shower daily with the wound uncovered. Water and soapy water should run over the incision area. Do not wash directly on the incision for 2 weeks. Remove the glue after 2 weeks.   Driving Restrictions    Complete by:  As directed    No driving for 2 weeks. May ride in the car locally now. May begin to drive locally in 2 weeks.   Incentive spirometry RT    Complete by:  As directed    Other Restrictions    Complete by:  As directed    Walk gradually increasing distances out in the fresh air at least twice a day. Walking additional 6 times inside the house, gradually increasing distances, daily. No bending, lifting, or twisting. Perform activities between shoulder and waist height (that is at counter height when standing or table height when  sitting).     Allergies as of 11/22/2016      Reactions   No Known Allergies       Medication List    TAKE these medications   atorvastatin 20 MG tablet Commonly known as:  LIPITOR Take 20 mg by mouth daily at 6 PM.   cholecalciferol 1000 units tablet Commonly known as:  VITAMIN D Take 1,000 Units by mouth daily.   cycloSPORINE 0.05 % ophthalmic emulsion Commonly known as:  RESTASIS Place 1 drop into both eyes 2 (two) times daily.   fexofenadine-pseudoephedrine 180-240 MG 24 hr tablet Commonly known as:  ALLEGRA-D 24 Take 1 tablet by mouth daily.   folic acid 1 MG tablet Commonly known as:  FOLVITE Take 1 mg by mouth 2 (two) times daily.   Krill Oil Omega-3 300 MG Caps Take 1 capsule by mouth daily.   levothyroxine 112 MCG tablet Commonly known as:  SYNTHROID, LEVOTHROID Take 112 mcg by mouth daily before breakfast.   losartan-hydrochlorothiazide 100-12.5 MG tablet Commonly known as:  HYZAAR Take 1 tablet by mouth daily.   Lysine 1000 MG Tabs Take 1,000 mg by mouth 2 (two) times daily.   methocarbamol 500 MG tablet Commonly known as:  ROBAXIN Take 1 tablet (500 mg total) by mouth every 6 (six) hours as needed for muscle spasms.   methotrexate 2.5 MG tablet Commonly known as:  RHEUMATREX Take 25 mg by mouth once a week. Takes 10 tablets Caution:Chemotherapy. Protect from light. Takes on Wednesday   multivitamin with minerals  Tabs tablet Take 1 tablet by mouth daily.   naproxen sodium 220 MG tablet Commonly known as:  ANAPROX Take 220 mg by mouth 2 (two) times daily as needed (pain).   oxyCODONE-acetaminophen 5-325 MG tablet Commonly known as:  PERCOCET/ROXICET Take 1-2 tablets by mouth every 4 (four) hours as needed for moderate pain.   PROBIOTIC PO Take 1 tablet by mouth daily.   SUDAFED 24 HOUR PO Take 1 tablet by mouth daily as needed (sinus pressure).   valACYclovir 500 MG tablet Commonly known as:  VALTREX Take 2,000 mg by mouth 2 (two) times  daily as needed (cold sores).   VIACTIV [id number] MG-UNT-MCG Chew Generic drug:  Calcium-Vitamin D-Vitamin K Chew 2 tablets by mouth daily.   zolpidem 10 MG tablet Commonly known as:  AMBIEN Take 2.5 mg by mouth at bedtime as needed for sleep.        SignedHosie Spangle 11/22/2016, 9:20 AM

## 2016-11-22 NOTE — Evaluation (Signed)
Occupational Therapy Evaluation and Discharge Patient Details Name: Allison Randall MRN: [id number] DOB: 03/14/1951 Today's Date: 11/22/2016    History of Present Illness Patient is a 66 yo female s/p Lumbar four-five Posterior Lumbar Interbody fusion.   Clinical Impression   Pt reports she was independent with ADL PTA. Currently pt mod I with ADL and functional mobility. All back, safety, and ADL education completed with pt. Pt planning to d/c home with 24/7 supervision from family. No further acute OT needs identified; signing off at this time. Please re-consult if needs change. Thank you for this referral.    Follow Up Recommendations  No OT follow up;Supervision - Intermittent    Equipment Recommendations  None recommended by OT    Recommendations for Other Services       Precautions / Restrictions Precautions Precautions: Back Precaution Booklet Issued: No (Already provided by PT) Precaution Comments: Pt able to recall 3/3 back precautions Required Braces or Orthoses: Spinal Brace Spinal Brace: Lumbar corset Restrictions Weight Bearing Restrictions: No      Mobility Bed Mobility Overal bed mobility: Modified Independent             General bed mobility comments: no physical assist required  Transfers Overall transfer level: Modified independent               General transfer comment: increased time to perform, no cues or assist required    Balance Overall balance assessment: No apparent balance deficits (not formally assessed)                                          ADL Overall ADL's : Modified independent                                       General ADL Comments: Pt able to complete ADL and functional mobility at mod I level. Educated pt on compensatory strategies for LB ADL, 2 cups for oral care, proper technique for peri care without twisting, keeping frequently used items at counter top height, bed mobility,  frequent mobility throughout the day upon return home, donning/doffing back brace.      Vision Vision Assessment?: No apparent visual deficits   Perception     Praxis      Pertinent Vitals/Pain Pain Assessment: Faces Faces Pain Scale: No hurt     Hand Dominance Right   Extremity/Trunk Assessment Upper Extremity Assessment Upper Extremity Assessment: Overall WFL for tasks assessed   Lower Extremity Assessment Lower Extremity Assessment: Overall WFL for tasks assessed   Cervical / Trunk Assessment Cervical / Trunk Assessment: Other exceptions Cervical / Trunk Exceptions: s/p lumbar sx   Communication Communication Communication: No difficulties   Cognition Arousal/Alertness: Awake/alert Behavior During Therapy: WFL for tasks assessed/performed Overall Cognitive Status: Within Functional Limits for tasks assessed                     General Comments       Exercises       Shoulder Instructions      Home Living Family/patient expects to be discharged to:: Private residence Living Arrangements: Spouse/significant other Available Help at Discharge: Family Type of Home: House Home Access: Stairs to enter Technical brewer of Steps: 4 Entrance Stairs-Rails: Right Home Layout: One level  Bathroom Shower/Tub: Tub/shower unit;Walk-in shower   Bathroom Toilet: Standard     Home Equipment: Cane - single point          Prior Functioning/Environment Level of Independence: Independent                 OT Problem List:     OT Treatment/Interventions:      OT Goals(Current goals can be found in the care plan section) Acute Rehab OT Goals Patient Stated Goal: return home OT Goal Formulation: All assessment and education complete, DC therapy  OT Frequency:     Barriers to D/C:            Co-evaluation              End of Session Equipment Utilized During Treatment: Back brace Nurse Communication: Mobility status;Other  (comment) (no f/u or equipment needs)  Activity Tolerance: Patient tolerated treatment well Patient left: in bed;with call bell/phone within reach   Time: [id number] OT Time Calculation (min): 17 min Charges:  OT General Charges $OT Visit: 1 Procedure OT Evaluation $OT Eval Moderate Complexity: 1 Procedure G-Codes:     Binnie Kand M.S., OTR/L Pager: [phone]  11/22/2016, 9:15 AM

## 2016-11-26 MED FILL — Heparin Sodium (Porcine) Inj 1000 Unit/ML: INTRAMUSCULAR | Qty: 30 | Status: AC

## 2016-11-26 MED FILL — Sodium Chloride IV Soln 0.9%: INTRAVENOUS | Qty: 2000 | Status: AC

## 2017-03-16 NOTE — Addendum Note (Signed)
Addendum  created 03/16/17 1058 by Myrtie Soman, MD   Sign clinical note

## 2017-09-24 ENCOUNTER — Ambulatory Visit
Admission: RE | Admit: 2017-09-24 | Discharge: 2017-09-24 | Disposition: A | Discharge status: Home / Self Care | Payer: Medicare Other | Source: Ambulatory Visit | Attending: Family Medicine | Admitting: Family Medicine

## 2017-09-24 ENCOUNTER — Other Ambulatory Visit: Payer: Self-pay | Admitting: Family Medicine

## 2017-09-24 DIAGNOSIS — R2241 Localized swelling, mass and lump, right lower limb: Secondary | ICD-10-CM

## 2017-10-29 ENCOUNTER — Other Ambulatory Visit: Payer: Self-pay | Admitting: Gynecology

## 2017-10-29 DIAGNOSIS — Z1231 Encounter for screening mammogram for malignant neoplasm of breast: Secondary | ICD-10-CM

## 2017-11-09 ENCOUNTER — Ambulatory Visit
Admission: RE | Admit: 2017-11-09 | Discharge: 2017-11-09 | Disposition: A | Discharge status: Home / Self Care | Payer: Medicare Other | Source: Ambulatory Visit | Attending: Gynecology | Admitting: Gynecology

## 2017-11-09 DIAGNOSIS — Z1231 Encounter for screening mammogram for malignant neoplasm of breast: Secondary | ICD-10-CM

## 2018-03-14 IMAGING — MG 2D DIGITAL SCREENING BILATERAL MAMMOGRAM WITH CAD AND ADJUNCT TO
7 series · 8 of 15 positions shown · non-contrast
Comparison: Previous exam(s).

CLINICAL DATA: Screening.

EXAM:
2D DIGITAL SCREENING BILATERAL MAMMOGRAM WITH CAD AND ADJUNCT TOMO

[R CC synth-2D]
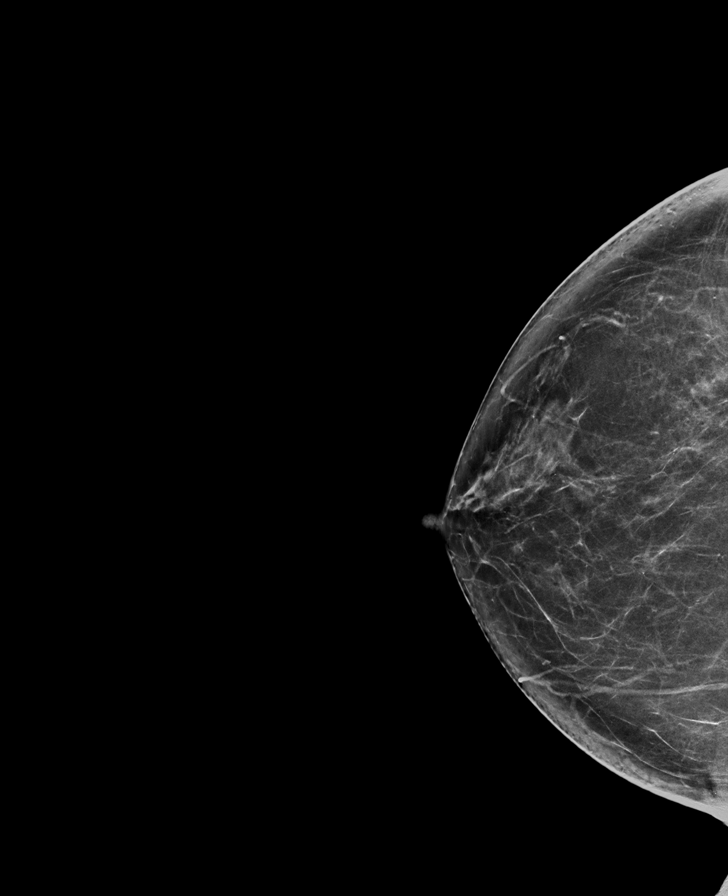

[R CC]
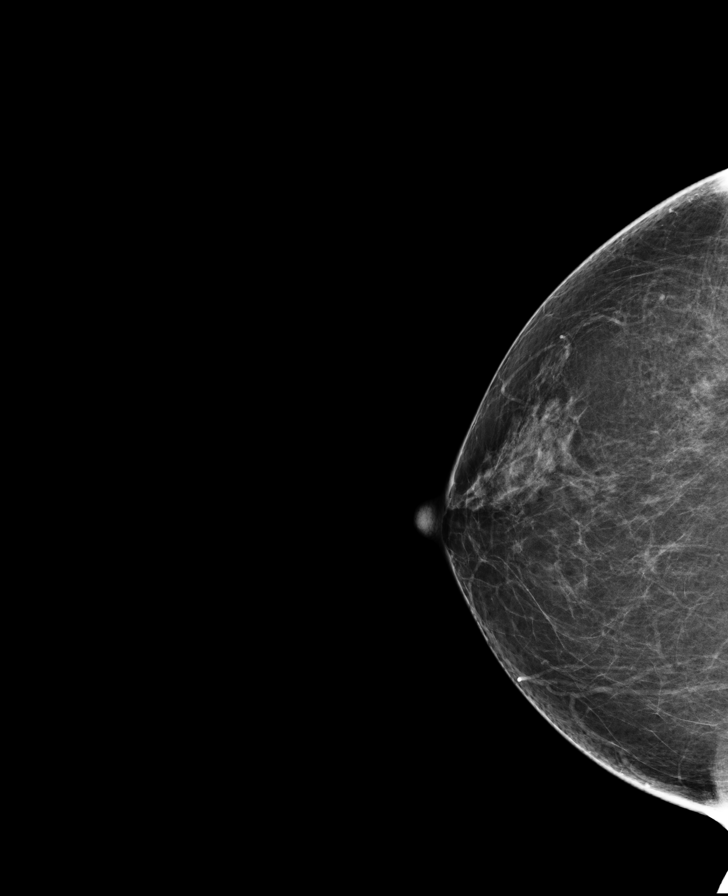

[L MLO synth-2D]
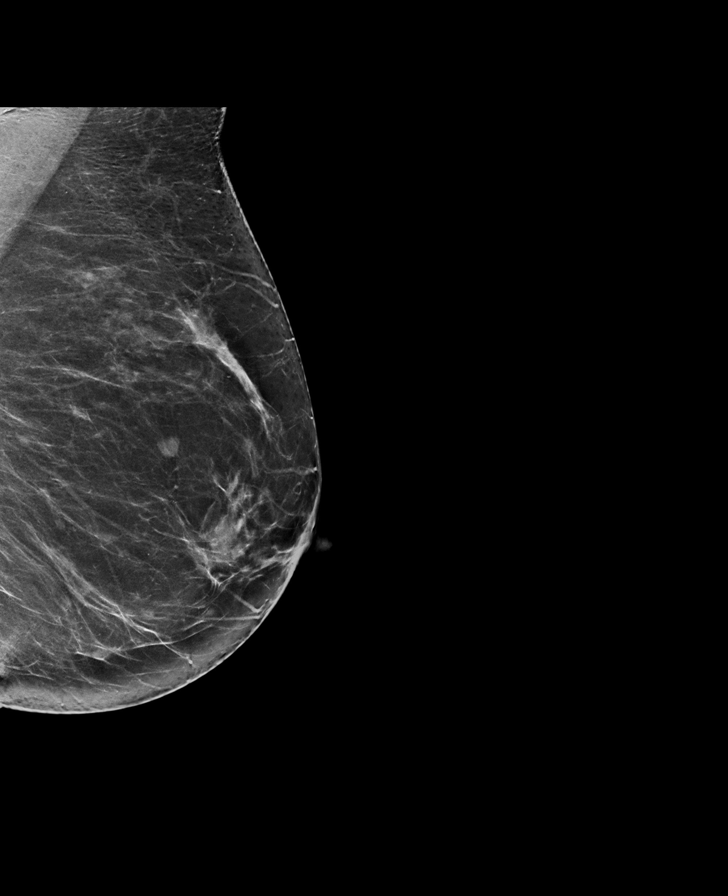

[L CC]
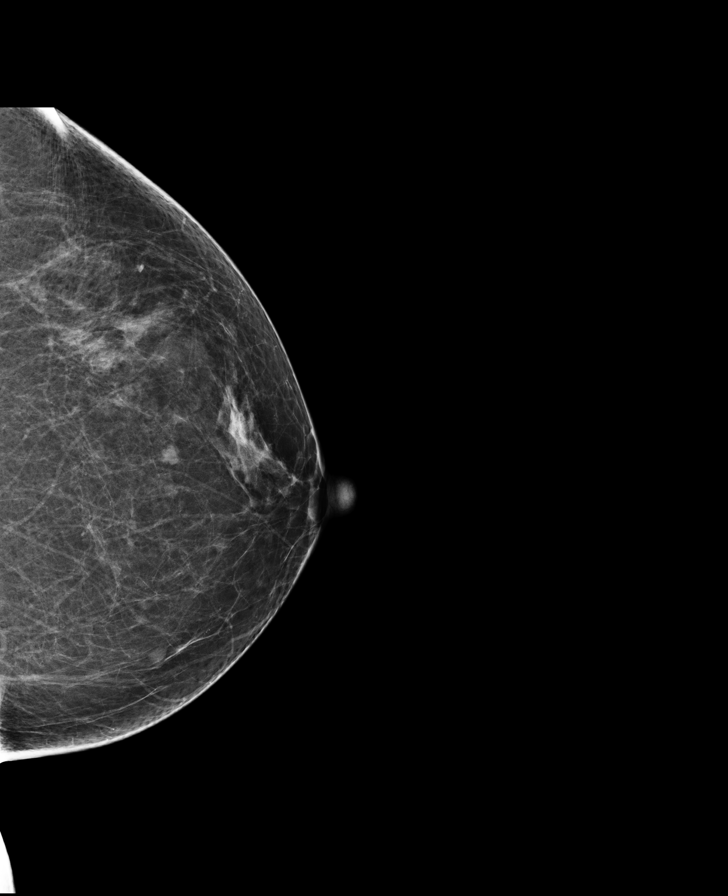

[R MLO]
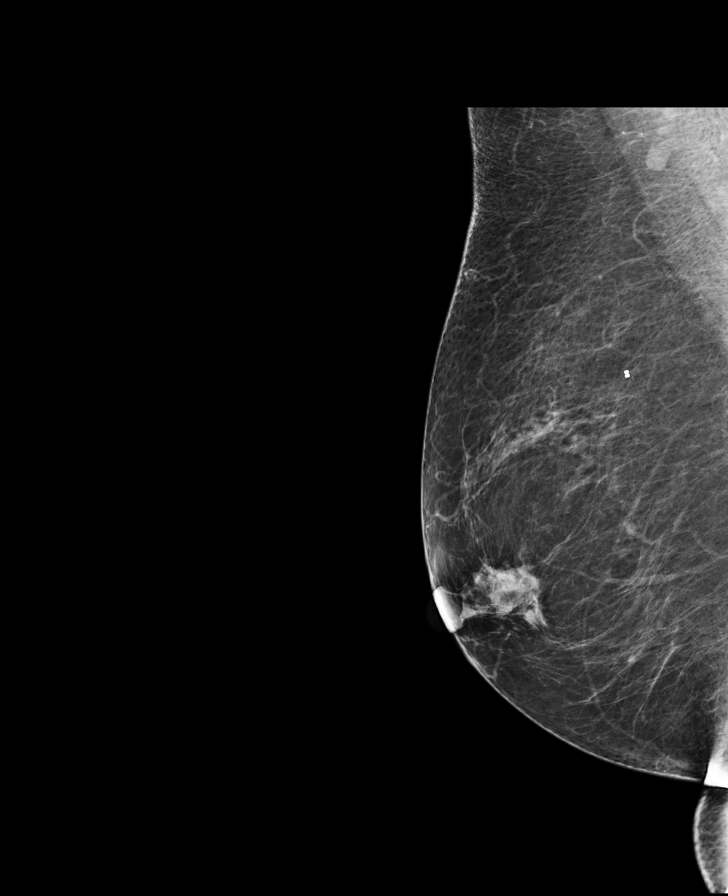

[L CC tomo · 2 of 74 frames shown]
[frame 24/74]
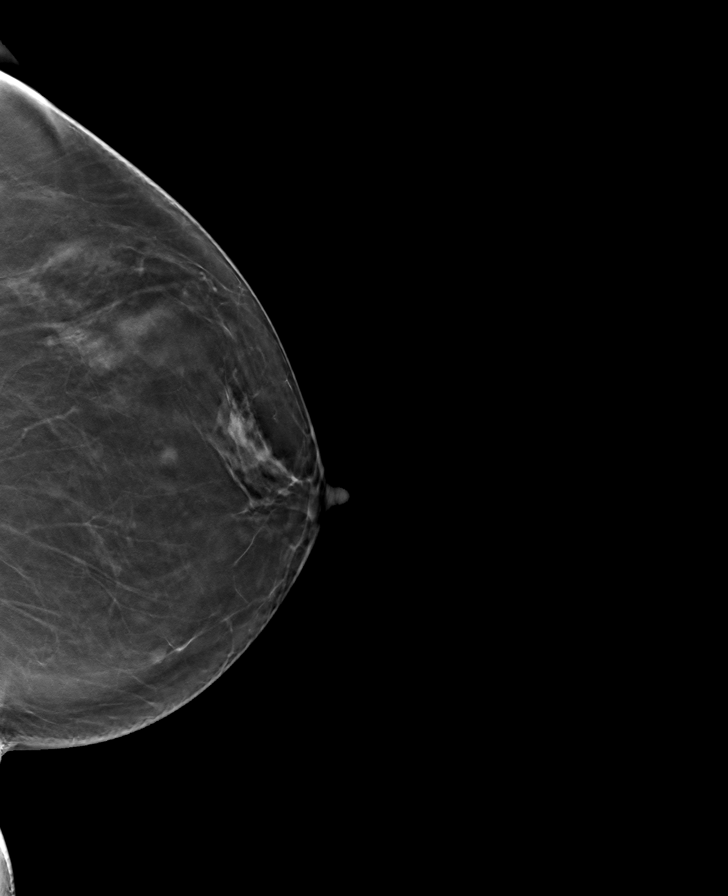
[frame 37/74]
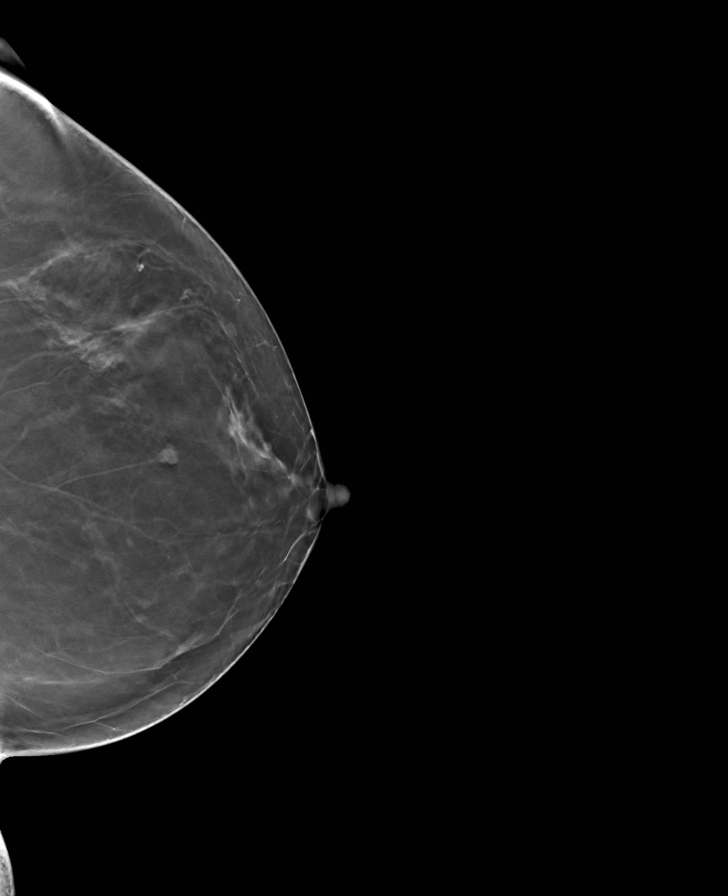

[L MLO tomo · tomo slice 39/76.0]
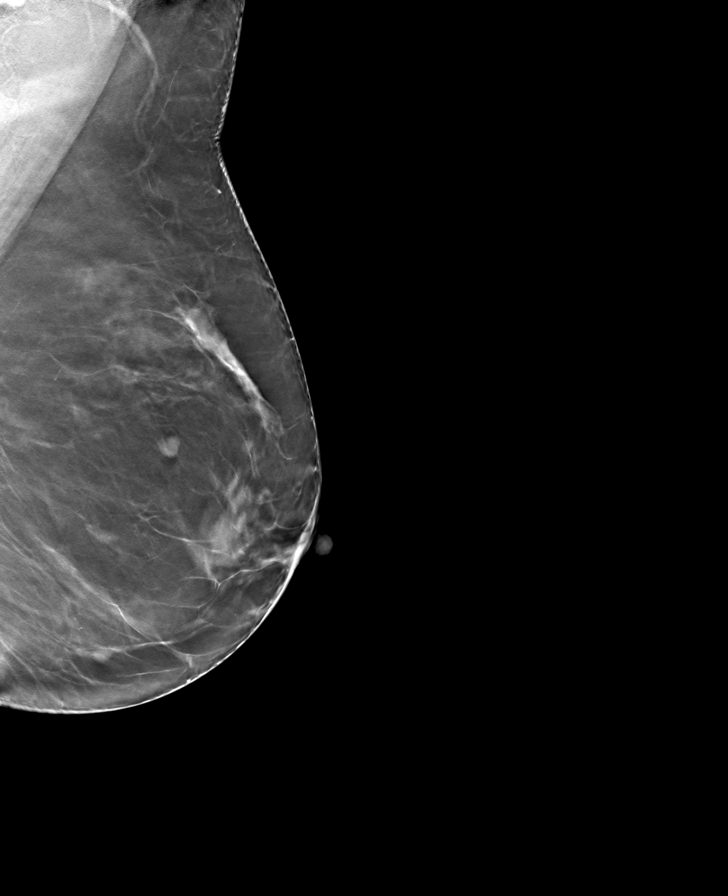

[8 of 15 positions shown; findings below may reference images not displayed]

ACR Breast Density Category b: There are scattered areas of
fibroglandular density.
FINDINGS: There are no findings suspicious for malignancy. Images were
processed with CAD.
IMPRESSION: No mammographic evidence of malignancy. A result letter of this
screening mammogram will be mailed directly to the patient.

RECOMMENDATION:
Screening mammogram in one year. (Code:97-6-RS4)

BI-RADS CATEGORY  1: Negative.

## 2018-10-12 ENCOUNTER — Other Ambulatory Visit: Payer: Self-pay | Admitting: Gynecology

## 2018-10-12 ENCOUNTER — Other Ambulatory Visit: Payer: Self-pay | Admitting: Obstetrics and Gynecology

## 2018-10-12 DIAGNOSIS — Z1231 Encounter for screening mammogram for malignant neoplasm of breast: Secondary | ICD-10-CM

## 2018-10-14 ENCOUNTER — Other Ambulatory Visit: Payer: Self-pay | Admitting: Gynecology

## 2018-10-14 DIAGNOSIS — Z1231 Encounter for screening mammogram for malignant neoplasm of breast: Secondary | ICD-10-CM

## 2018-11-19 ENCOUNTER — Ambulatory Visit
Admission: RE | Admit: 2018-11-19 | Discharge: 2018-11-19 | Disposition: A | Discharge status: Home / Self Care | Payer: Medicare Other | Source: Ambulatory Visit | Attending: Obstetrics and Gynecology | Admitting: Obstetrics and Gynecology

## 2018-11-19 DIAGNOSIS — Z1231 Encounter for screening mammogram for malignant neoplasm of breast: Secondary | ICD-10-CM

## 2018-11-23 ENCOUNTER — Other Ambulatory Visit: Payer: Self-pay | Admitting: Neurological Surgery

## 2018-11-23 DIAGNOSIS — R5381 Other malaise: Secondary | ICD-10-CM

## 2018-11-29 ENCOUNTER — Other Ambulatory Visit: Payer: Self-pay | Admitting: Neurological Surgery

## 2018-11-29 DIAGNOSIS — M810 Age-related osteoporosis without current pathological fracture: Secondary | ICD-10-CM

## 2018-12-20 ENCOUNTER — Ambulatory Visit
Admission: RE | Admit: 2018-12-20 | Discharge: 2018-12-20 | Disposition: A | Discharge status: Home / Self Care | Payer: Medicare Other | Source: Ambulatory Visit | Attending: Neurological Surgery | Admitting: Neurological Surgery

## 2018-12-20 DIAGNOSIS — M810 Age-related osteoporosis without current pathological fracture: Secondary | ICD-10-CM

## 2019-01-25 ENCOUNTER — Other Ambulatory Visit: Payer: Medicare Other

## 2019-10-20 ENCOUNTER — Other Ambulatory Visit: Payer: Self-pay | Admitting: Gynecology

## 2019-10-20 DIAGNOSIS — Z1231 Encounter for screening mammogram for malignant neoplasm of breast: Secondary | ICD-10-CM

## 2019-11-11 ENCOUNTER — Ambulatory Visit: Payer: Medicare Other

## 2019-11-30 ENCOUNTER — Other Ambulatory Visit: Payer: Self-pay

## 2019-11-30 ENCOUNTER — Ambulatory Visit
Admission: RE | Admit: 2019-11-30 | Discharge: 2019-11-30 | Disposition: A | Discharge status: Home / Self Care | Payer: Medicare Other | Source: Ambulatory Visit | Attending: Gynecology | Admitting: Gynecology

## 2019-11-30 DIAGNOSIS — Z1231 Encounter for screening mammogram for malignant neoplasm of breast: Secondary | ICD-10-CM

## 2019-12-02 ENCOUNTER — Ambulatory Visit: Payer: Medicare Other

## 2019-12-27 LAB — COLOGUARD: COLOGUARD: NEGATIVE

## 2020-10-17 ENCOUNTER — Other Ambulatory Visit: Payer: Self-pay | Admitting: Gynecology

## 2020-10-17 DIAGNOSIS — Z1231 Encounter for screening mammogram for malignant neoplasm of breast: Secondary | ICD-10-CM

## 2020-11-27 ENCOUNTER — Other Ambulatory Visit: Payer: Self-pay

## 2020-11-27 ENCOUNTER — Ambulatory Visit
Admission: RE | Admit: 2020-11-27 | Discharge: 2020-11-27 | Disposition: A | Discharge status: Home / Self Care | Payer: Medicare Other | Source: Ambulatory Visit | Attending: Gynecology | Admitting: Gynecology

## 2020-11-27 ENCOUNTER — Ambulatory Visit: Payer: Medicare Other

## 2020-11-27 DIAGNOSIS — Z1231 Encounter for screening mammogram for malignant neoplasm of breast: Secondary | ICD-10-CM

## 2020-11-29 DIAGNOSIS — M542 Cervicalgia: Secondary | ICD-10-CM | POA: Insufficient documentation

## 2020-12-06 ENCOUNTER — Other Ambulatory Visit: Payer: Self-pay

## 2020-12-06 ENCOUNTER — Ambulatory Visit
Admission: RE | Admit: 2020-12-06 | Discharge: 2020-12-06 | Disposition: A | Discharge status: Home / Self Care | Payer: Medicare Other | Source: Ambulatory Visit | Attending: Gynecology | Admitting: Gynecology

## 2021-03-16 DIAGNOSIS — L578 Other skin changes due to chronic exposure to nonionizing radiation: Secondary | ICD-10-CM | POA: Insufficient documentation

## 2021-03-16 DIAGNOSIS — R040 Epistaxis: Secondary | ICD-10-CM | POA: Insufficient documentation

## 2021-03-25 ENCOUNTER — Ambulatory Visit (INDEPENDENT_AMBULATORY_CARE_PROVIDER_SITE_OTHER): Payer: Medicare Other | Admitting: Infectious Disease

## 2021-03-25 ENCOUNTER — Other Ambulatory Visit: Payer: Self-pay

## 2021-03-25 ENCOUNTER — Encounter: Payer: Self-pay | Admitting: Infectious Disease

## 2021-03-25 VITALS — BP 143/70 | HR 91 | Temp 97.9°F | Ht 64.0 in | Wt 137.0 lb

## 2021-03-25 DIAGNOSIS — M069 Rheumatoid arthritis, unspecified: Secondary | ICD-10-CM

## 2021-03-25 DIAGNOSIS — B009 Herpesviral infection, unspecified: Secondary | ICD-10-CM | POA: Diagnosis not present

## 2021-03-25 DIAGNOSIS — M057A Rheumatoid arthritis with rheumatoid factor of other specified site without organ or systems involvement: Secondary | ICD-10-CM

## 2021-03-25 DIAGNOSIS — A318 Other mycobacterial infections: Secondary | ICD-10-CM | POA: Diagnosis not present

## 2021-03-25 DIAGNOSIS — Z780 Asymptomatic menopausal state: Secondary | ICD-10-CM | POA: Insufficient documentation

## 2021-03-25 HISTORY — DX: Herpesviral infection, unspecified: B00.9

## 2021-03-25 HISTORY — DX: Other mycobacterial infections: A31.8

## 2021-03-25 HISTORY — DX: Rheumatoid arthritis, unspecified: M06.9

## 2021-03-25 MED ORDER — AMBULATORY NON FORMULARY MEDICATION: 100.0000 mg | Freq: Every day | 2 refills | Status: DC

## 2021-03-25 MED ORDER — AZITHROMYCIN 500 MG PO TABS: 500.0000 mg | ORAL_TABLET | Freq: Every day | ORAL | 5 refills | Status: DC

## 2021-03-25 NOTE — Progress Notes (Signed)
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   MRN: [id number]  HPI  [name] is a 70 year old lady who has a past medical history significant for rheumatoid arthritis, hyperlipidemia HSV infection, who had been getting regular treatment with saline injections to treat varicose veins.  The past these had gone without complication but however this past February she says that the typical erythematous areas in her legs that ensue after injection of the saline failed to resolve and several areas on her upper legs in particular became more erythematous and more inflamed.  She was given several rounds of antibiotics and seen by dermatology who ultimately performed a biopsy which came back with granulomatous pathology as well as evidence of HSV faction with inclusion bodies present and stains positive.  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 Delman Cheadle.  M chelonae I susceptibilities are shown before and picture.   The only reasonable options I see for her would be oral azithromycin 500 mg along with clofazimine 2 tablets once daily.  Cassie has procured clofazimine for her and sent in prescription for azithromycin.    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    Social History   Socioeconomic History   Marital  status: Married    Spouse name: Not on file   Number of children: Not on file   Years of education: Not on file   Highest education level: Not on file  Occupational History   Not on file  Tobacco Use   Smoking status: Former    Pack years: 0.00    Types: Cigarettes    Quit date: 11/30/2007    Years since quitting: 13.3   Smokeless tobacco: Never  Substance and Sexual Activity   Alcohol use: No   Drug use: No   Sexual activity: Not on file  Other Topics Concern   Not on file  Social History Narrative   Not on file   Social Determinants of Health   Financial Resource Strain: Not on file  Food Insecurity: Not on file  Transportation Needs: Not on file  Physical Activity: Not on file  Stress: Not on file  Social Connections: Not on file    Allergies  Allergen Reactions   No Known Allergies      Current Outpatient Medications:    atorvastatin (LIPITOR) 20 MG tablet, Take 20 mg by mouth daily at 6 PM., Disp: , Rfl:    Calcium-Vitamin D-Vitamin K 500-100-40 MG-UNT-MCG CHEW, Chew 2 tablets by mouth daily., Disp: , Rfl:    cholecalciferol (VITAMIN D) 1000 UNITS tablet, Take 1,000 Units by mouth daily., Disp: , Rfl:    cycloSPORINE (RESTASIS) 0.05 % ophthalmic emulsion, Place 1 drop into both eyes 2 (two) times  daily., Disp: , Rfl:    fexofenadine-pseudoephedrine (ALLEGRA-D 24) 180-240 MG 24 hr tablet, Take 1 tablet by mouth daily., Disp: , Rfl:    folic acid (FOLVITE) 1 MG tablet, Take 1 mg by mouth 2 (two) times daily. , Disp: , Rfl:    Krill Oil Omega-3 300 MG CAPS, Take 1 capsule by mouth daily., Disp: , Rfl:    levothyroxine (SYNTHROID, LEVOTHROID) 112 MCG tablet, Take 112 mcg by mouth daily before breakfast., Disp: , Rfl:    losartan-hydrochlorothiazide (HYZAAR) 100-12.5 MG per tablet, Take 1 tablet by mouth daily., Disp: , Rfl:    Lysine 1000 MG TABS, Take 1,000 mg by mouth 2 (two) times daily., Disp: , Rfl:    methocarbamol (ROBAXIN) 500 MG tablet, Take 1 tablet (500 mg  total) by mouth every 6 (six) hours as needed for muscle spasms., Disp: 60 tablet, Rfl: 0   methotrexate (RHEUMATREX) 2.5 MG tablet, Take 25 mg by mouth once a week. Takes 10 tablets Caution:Chemotherapy. Protect from light. Takes on Wednesday, Disp: , Rfl:    Multiple Vitamin (MULITIVITAMIN WITH MINERALS) TABS, Take 1 tablet by mouth daily., Disp: , Rfl:    naproxen sodium (ANAPROX) 220 MG tablet, Take 220 mg by mouth 2 (two) times daily as needed (pain)., Disp: , Rfl:    Pseudoephedrine HCl (SUDAFED 24 HOUR PO), Take 1 tablet by mouth daily as needed (sinus pressure)., Disp: , Rfl:    valACYclovir (VALTREX) 500 MG tablet, Take 2,000 mg by mouth 2 (two) times daily as needed (cold sores). , Disp: , Rfl:    zolpidem (AMBIEN) 10 MG tablet, Take 2.5 mg by mouth at bedtime as needed for sleep., Disp: , Rfl:    oxyCODONE-acetaminophen (PERCOCET/ROXICET) 5-325 MG tablet, Take 1-2 tablets by mouth every 4 (four) hours as needed for moderate pain. (Patient not taking: Reported on 03/25/2021), Disp: 70 tablet, Rfl: 0   Probiotic Product (PROBIOTIC PO), Take 1 tablet by mouth daily. (Patient not taking: Reported on 03/25/2021), Disp: , Rfl:    Review of Systems  Constitutional:  Negative for chills and fever.  HENT:  Negative for congestion, ear discharge, ear pain, facial swelling, hearing loss, mouth sores, nosebleeds, postnasal drip, rhinorrhea and sore throat.   Eyes:  Negative for photophobia and discharge.  Respiratory:  Negative for cough, shortness of breath and wheezing.   Cardiovascular:  Negative for chest pain, palpitations and leg swelling.  Gastrointestinal:  Negative for abdominal pain, blood in stool, constipation, diarrhea, nausea and vomiting.  Endocrine: Negative for cold intolerance, heat intolerance and polydipsia.  Genitourinary:  Negative for dysuria, flank pain and hematuria.  Musculoskeletal:  Negative for arthralgias, back pain, gait problem and myalgias.  Skin:  Positive for  color change and rash.  Allergic/Immunologic: Negative for food allergies and immunocompromised state.  Neurological:  Negative for dizziness, weakness and headaches.  Hematological:  Does not bruise/bleed easily.  Psychiatric/Behavioral:  Negative for agitation, behavioral problems, confusion, decreased concentration, dysphoric mood, hallucinations, self-injury and suicidal ideas. The patient is not nervous/anxious and is not hyperactive.       Objective:   Physical Exam Constitutional:      General: She is not in acute distress.    Appearance: She is not diaphoretic.  HENT:     Head: Normocephalic and atraumatic.     Right Ear: External ear normal.     Left Ear: External ear normal.     Nose: Nose normal.     Mouth/Throat:     Pharynx:  No oropharyngeal exudate.  Eyes:     General: No scleral icterus.       Right eye: No discharge.        Left eye: No discharge.     Extraocular Movements: Extraocular movements intact.     Conjunctiva/sclera: Conjunctivae normal.  Cardiovascular:     Rate and Rhythm: Normal rate and regular rhythm.     Heart sounds:    No friction rub.  Pulmonary:     Effort: Pulmonary effort is normal. No respiratory distress.     Breath sounds: No wheezing or rales.  Abdominal:     General: There is no distension.     Palpations: Abdomen is soft.     Tenderness: There is no rebound.  Musculoskeletal:        General: No tenderness. Normal range of motion.     Cervical back: Normal range of motion and neck supple.  Lymphadenopathy:     Cervical: No cervical adenopathy.  Skin:    General: Skin is warm and dry.     Coloration: Skin is not jaundiced or pale.     Findings: Lesion and rash present. No erythema.  Neurological:     General: No focal deficit present.     Mental Status: She is alert and oriented to person, place, and time.     Coordination: Coordination normal.  Psychiatric:        Mood and Affect: Mood normal.        Behavior: Behavior  normal.        Thought Content: Thought content normal.        Judgment: Judgment normal.    Lesions that were biopsied and consistent with Mycobacterium infection 03/25/2021:           Assessment & Plan:   Mycobacterium chelonae infection: Suspect the patient's suspicion that the infusion of saline for her varicose veins was likely the culprit since saline can be contaminated with mycobacterial species.  She is only on methotrexate in terms of immunosuppressive therapy and thankfully not on potent Biologics.    We will start azithromycin 500 mg and clofazimine 2 tablets daily  We will aim to get her through 3 to 6 months of therapy and see her back in 1 month's time.  Cassie is given her her contact number as well.  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  She has received Valtrex and this should have resolved   RA: Only on methotrexate and I do not think that we need to insist on her stopping this but can be continued.   I spent more than 80 minutes with the patient including greater than 50% of time in face to face counseling of the patient personally reviewing radiographs, along with pertinent laboratory microbiological data review of medical records and in coordination of her care.

## 2021-03-25 NOTE — Patient Instructions (Signed)
Rubin Payor, Pharmacist [phone]

## 2021-03-25 NOTE — Progress Notes (Signed)
Counseled patient to take TWO clofazimine capsules (100 mg) together once daily. Advised patient not to separate capsules and to make sure to take them together. Explained that the tablets are 50 mg each but the dose needed is 100 mg, therefore the need to take two capsules together. Encouraged patient not to miss any doses and to continue taking until discontinued.  Counseled patient on what to do if dose is missed - if it is closer to the missed dose take immediately; if closer to next dose skip dose and take the next dose at the usual time. Counseled patient that side effects are usually observed on higher doses but that common side effects include GI upset with nausea and diarrhea. Taking clofazimine with food may help the nausea. Counseled patient that clofazimine should also be taken with a full glass of water. Also counseled patient that medication can darken skin and other secretions such as tears, saliva, and urine. Advised patient to avoid direct sun exposure while on clofazimine as the medication can increase sun sensitivity.  Asked that the patient wear sunscreen, a hat, and long sleeves while outside.  Other common side effects include skin dryness and liver toxicity but advised patient that we will be monitoring liver function throughout therapy. All side effects tend to resolve after discontinuation of clofazimine. Answered all questions. Gave patient 2 bottles of clofazimine which is a 3 months's supply. Asked patient to call me when they are down to only half of a bottle left. Patient will call me if any issues arise.

## 2021-04-10 ENCOUNTER — Telehealth: Payer: Self-pay | Admitting: Pharmacist

## 2021-04-10 NOTE — Telephone Encounter (Signed)
Sent in clofazimine patient application. She has already started on the medication.

## 2021-04-16 ENCOUNTER — Telehealth: Payer: Self-pay | Admitting: Pharmacist

## 2021-04-16 NOTE — Telephone Encounter (Signed)
Marsi called regarding whether she should take probiotics while taking azithromycin and clofazimine. Discussed that there are mixed data regarding the benefits of probiotics while taking antibiotics. She requested probiotic options if she wanted to start, and I stated Activia yogurt would be a good place to start.   She also states she follows up with her rheumatologist every 3 months, and they check a CMet at each visit if she could save extra bloodwork from [facility].

## 2021-04-26 ENCOUNTER — Ambulatory Visit: Payer: Medicare Other | Admitting: Infectious Disease

## 2021-04-29 ENCOUNTER — Telehealth: Payer: Self-pay

## 2021-04-29 NOTE — Telephone Encounter (Signed)
Contacted patient to relay provider message about medications. Patient Is going to think about it (whether to remain on abx or stop to determine if hearing loss is related to their usage) has a follow up with ENT in 1 month. She will remain on medication in the meantime.   Etai Copado Lorita Officer, RN

## 2021-04-29 NOTE — Telephone Encounter (Signed)
Patient called to report a decrease in her hearing. Reports it started a few days ago and has worsened since then. She is on azithromycin and clofazamine. Reports reading online that hearing loss is a side effect of azithro and is concerned about it as it pertains to her. Has follow up with [facility] on 8/2 and an appointment with ENT on 8/3.  Forwarding to pharmacy team and provider.   Allison Zuniga Lorita Officer, RN

## 2021-05-14 ENCOUNTER — Ambulatory Visit (INDEPENDENT_AMBULATORY_CARE_PROVIDER_SITE_OTHER): Payer: Medicare Other | Admitting: Infectious Disease

## 2021-05-14 ENCOUNTER — Other Ambulatory Visit: Payer: Self-pay

## 2021-05-14 ENCOUNTER — Encounter: Payer: Self-pay | Admitting: Infectious Disease

## 2021-05-14 VITALS — BP 136/79 | HR 97 | Temp 98.1°F | Wt 133.0 lb

## 2021-05-14 DIAGNOSIS — R110 Nausea: Secondary | ICD-10-CM

## 2021-05-14 DIAGNOSIS — B009 Herpesviral infection, unspecified: Secondary | ICD-10-CM

## 2021-05-14 DIAGNOSIS — I10 Essential (primary) hypertension: Secondary | ICD-10-CM | POA: Diagnosis not present

## 2021-05-14 DIAGNOSIS — H9193 Unspecified hearing loss, bilateral: Secondary | ICD-10-CM

## 2021-05-14 DIAGNOSIS — A318 Other mycobacterial infections: Secondary | ICD-10-CM

## 2021-05-14 DIAGNOSIS — M057A Rheumatoid arthritis with rheumatoid factor of other specified site without organ or systems involvement: Secondary | ICD-10-CM | POA: Diagnosis not present

## 2021-05-14 DIAGNOSIS — H9313 Tinnitus, bilateral: Secondary | ICD-10-CM | POA: Diagnosis not present

## 2021-05-14 DIAGNOSIS — H9319 Tinnitus, unspecified ear: Secondary | ICD-10-CM

## 2021-05-14 DIAGNOSIS — H9190 Unspecified hearing loss, unspecified ear: Secondary | ICD-10-CM

## 2021-05-14 HISTORY — DX: Nausea: R11.0

## 2021-05-14 HISTORY — DX: Unspecified hearing loss, unspecified ear: H91.90

## 2021-05-14 HISTORY — DX: Tinnitus, unspecified ear: H93.19

## 2021-05-14 NOTE — Progress Notes (Signed)
Subjective:  Chief complaints: Nausea when taking her clofazimine and azithromycin as well as tinnitus and hearing loss   Patient ID: Allison Randall, female    DOB: [birth year], 70 y.o.   MRN: [id number]  HPI  Allison Randall is a 70 year old lady who has a past medical history significant for rheumatoid arthritis, hyperlipidemia HSV infection, who had been getting regular treatment with saline injections to treat varicose veins.  The past these had gone without complication but however this past February she says that the typical erythematous areas in her legs that ensue after injection of the saline failed to resolve and several areas on her upper legs in particular became more erythematous and more inflamed.  She was given several rounds of antibiotics and seen by dermatology who ultimately performed a biopsy which came back with granulomatous pathology as well as evidence of HSV faction with inclusion bodies present and stains positive.  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 Delman Cheadle.  M chelonae I susceptibilities are shown before and picture.   The only reasonable options I saw for her would be oral azithromycin 500 mg along with clofazimine 2 tablets once daily.  She started clofazimine and azithromycin.  She seem to be doing pretty well.  Then in June she went to a very loud country music concert and afterwards noticed that she had loss of hearing and also tinnitus this persisted and continues to the present day and she is visibly hard of hearing in the clinic.  She had looked up possibility of azithromycin causing hearing loss and while rare it can indeed do so.  I had heard of this I had counseled her to stop the antibiotics but she wanted to continue at the time since there were not many antibiotic options.  Certainly I think at this point in time she should stop her antibiotics to's make sure  that the azithromycin is not causing her ongoing hearing loss in particular because hearing loss should be reversible with discontinuation of azithromycin she is also to see ear nose and throat tomorrow.  Also was suffering from nausea and some indigestion after taking the medications.      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    Social History   Socioeconomic History   Marital status: Married    Spouse name: Not on file   Number of children: Not on file   Years of education: Not on file   Highest education level: Not on file  Occupational History   Not on file  Tobacco Use   Smoking status: Former    Types: Cigarettes    Quit date: 11/30/2007    Years since quitting: 13.4   Smokeless tobacco: Never  Substance and Sexual Activity   Alcohol use: No   Drug use: No   Sexual activity: Not on file  Other Topics Concern   Not on file  Social History  Narrative   Not on file   Social Determinants of Health   Financial Resource Strain: Not on file  Food Insecurity: Not on file  Transportation Needs: Not on file  Physical Activity: Not on file  Stress: Not on file  Social Connections: Not on file    Allergies  Allergen Reactions   No Known Allergies      Current Outpatient Medications:    AMBULATORY NON FORMULARY MEDICATION, Take 100 mg by mouth daily. Medication Name: Clofazimine, Disp: 100 capsule, Rfl: 2   atorvastatin (LIPITOR) 20 MG tablet, Take 20 mg by mouth daily at 6 PM., Disp: , Rfl:    azithromycin (ZITHROMAX) 500 MG tablet, Take 1 tablet (500 mg total) by  mouth daily., Disp: 30 tablet, Rfl: 5   Calcium-Vitamin D-Vitamin K 500-100-40 MG-UNT-MCG CHEW, Chew 2 tablets by mouth daily., Disp: , Rfl:    cholecalciferol (VITAMIN D) 1000 UNITS tablet, Take 1,000 Units by mouth daily., Disp: , Rfl:    cycloSPORINE (RESTASIS) 0.05 % ophthalmic emulsion, Place 1 drop into both eyes 2 (two) times daily., Disp: , Rfl:    fexofenadine-pseudoephedrine (ALLEGRA-D 24) 180-240 MG 24 hr tablet, Take 1 tablet by mouth daily., Disp: , Rfl:    folic acid (FOLVITE) 1 MG tablet, Take 1 mg by mouth 2 (two) times daily. , Disp: , Rfl:    Krill Oil Omega-3 300 MG CAPS, Take 1 capsule by mouth daily., Disp: , Rfl:    levothyroxine (SYNTHROID, LEVOTHROID) 112 MCG tablet, Take 112 mcg by mouth daily before breakfast., Disp: , Rfl:    losartan-hydrochlorothiazide (HYZAAR) 100-12.5 MG per tablet, Take 1 tablet by mouth daily., Disp: , Rfl:    Lysine 1000 MG TABS, Take 1,000 mg by mouth 2 (two) times daily., Disp: , Rfl:    methocarbamol (ROBAXIN) 500 MG tablet, Take 1 tablet (500 mg total) by mouth every 6 (six) hours as needed for muscle spasms., Disp: 60 tablet, Rfl: 0   methotrexate (RHEUMATREX) 2.5 MG tablet, Take 25 mg by mouth once a week. Takes 10 tablets Caution:Chemotherapy. Protect from light. Takes on Wednesday, Disp: , Rfl:    Multiple Vitamin (MULITIVITAMIN WITH MINERALS) TABS, Take 1 tablet by mouth daily., Disp: , Rfl:    naproxen sodium (ANAPROX) 220 MG tablet, Take 220 mg by mouth 2 (two) times daily as needed (pain)., Disp: , Rfl:    oxyCODONE-acetaminophen (PERCOCET/ROXICET) 5-325 MG tablet, Take 1-2 tablets by mouth every 4 (four) hours as needed for moderate pain., Disp: 70 tablet, Rfl: 0   Probiotic Product (PROBIOTIC PO), Take 1 tablet by mouth daily., Disp: , Rfl:    Pseudoephedrine HCl (SUDAFED 24 HOUR PO), Take 1 tablet by mouth daily as needed (sinus pressure)., Disp: , Rfl:    valACYclovir (VALTREX) 500 MG tablet, Take 2,000 mg by mouth 2 (two) times  daily as needed (cold sores). , Disp: , Rfl:    zolpidem (AMBIEN) 10 MG tablet, Take 2.5 mg by mouth at bedtime as needed for sleep., Disp: , Rfl:    Review of Systems  Constitutional:  Negative for chills and fever.  HENT:  Positive for tinnitus. Negative for congestion, ear discharge, ear pain, facial swelling, hearing loss, mouth sores, nosebleeds, postnasal drip, rhinorrhea and sore throat.   Eyes:  Negative for photophobia and discharge.  Respiratory:  Negative for cough, shortness of breath and wheezing.   Cardiovascular:  Negative for chest pain, palpitations and leg swelling.  Gastrointestinal:  Positive for nausea. Negative for abdominal pain,  blood in stool, constipation, diarrhea and vomiting.  Endocrine: Negative for cold intolerance, heat intolerance and polydipsia.  Genitourinary:  Negative for dysuria, flank pain and hematuria.  Musculoskeletal:  Negative for arthralgias, back pain, gait problem and myalgias.  Skin:  Positive for color change and rash.  Allergic/Immunologic: Negative for food allergies and immunocompromised state.  Neurological:  Negative for dizziness, weakness and headaches.  Hematological:  Does not bruise/bleed easily.  Psychiatric/Behavioral:  Negative for agitation, behavioral problems, confusion, decreased concentration, dysphoric mood, hallucinations, self-injury and suicidal ideas. The patient is not nervous/anxious and is not hyperactive.       Objective:   Physical Exam Constitutional:      General: She is not in acute distress.    Appearance: She is not diaphoretic.  HENT:     Head: Normocephalic and atraumatic.     Right Ear: External ear normal.     Left Ear: External ear normal.     Nose: Nose normal.     Mouth/Throat:     Pharynx: No oropharyngeal exudate.  Eyes:     General: No scleral icterus.       Right eye: No discharge.        Left eye: No discharge.     Extraocular Movements: Extraocular movements intact.      Conjunctiva/sclera: Conjunctivae normal.  Cardiovascular:     Rate and Rhythm: Normal rate and regular rhythm.     Heart sounds:    No friction rub.  Pulmonary:     Effort: Pulmonary effort is normal. No respiratory distress.     Breath sounds: No wheezing or rales.  Abdominal:     General: There is no distension.     Palpations: Abdomen is soft.     Tenderness: There is no rebound.  Musculoskeletal:        General: No tenderness. Normal range of motion.     Cervical back: Normal range of motion and neck supple.  Lymphadenopathy:     Cervical: No cervical adenopathy.  Skin:    General: Skin is warm and dry.     Coloration: Skin is not jaundiced or pale.     Findings: Lesion and rash present. No erythema.  Neurological:     General: No focal deficit present.     Mental Status: She is alert and oriented to person, place, and time.     Coordination: Coordination normal.  Psychiatric:        Mood and Affect: Mood normal.        Behavior: Behavior normal.        Thought Content: Thought content normal.        Judgment: Judgment normal.    Lesions that were biopsied and consistent with Mycobacterium infection 03/25/2021:      August 2nd 2022:           Hearing loss with tinnitus: She will stop both azithromycin and clofazimine she is seeing ear nose and throat tomorrow hopefully they can shed some light on this.  I will plan on seeing her in September  Mycobacterium chelonae eye infection: This infection has been responding to azithromycin and clofazimine but will need to abandon them for now.  If the azithromycin truly seems to have caused hearing loss and tinnitus we will have to see if we can look at another agent to substitute for it possibly omadacyline or bedalaquine  We will check a CBC differential and CMP for safety labs   Rheumatoid arthritis on methotrexate I am  getting a CBC and CMP and will fax these records to her rheumatologist Leigh Aurora  Nausea:  In future we will have her premedicate with Zofran prior to taking medications against an M chelonae  HSV infection likely was an autoinoculation event when she had these lesions appear.  I spent 36 minutes with the patient including  face to face counseling of the patient the nature of mycobacterial infections potential options for treatment regarding her hearing loss or nausea reviewing again the sensitivities have her mycobacterial [place] I organism in, review of medical records in preparation for the visit and during the visit and in coordination of her care.

## 2021-05-15 LAB — CBC WITH DIFFERENTIAL/PLATELET
Absolute Monocytes: 486 cells/uL (ref 200–950)
Basophils Absolute: 22 cells/uL (ref 0–200)
Basophils Relative: 0.5 %
Eosinophils Absolute: 112 cells/uL (ref 15–500)
Eosinophils Relative: 2.6 %
HCT: 35.3 % (ref 35.0–45.0)
Hemoglobin: 11.8 g/dL (ref 11.7–15.5)
Lymphs Abs: 1028 cells/uL (ref 850–3900)
MCH: 32.9 pg (ref 27.0–33.0)
MCHC: 33.4 g/dL (ref 32.0–36.0)
MCV: 98.3 fL (ref 80.0–100.0)
MPV: 9.1 fL (ref 7.5–12.5)
Monocytes Relative: 11.3 %
Neutro Abs: 2653 cells/uL (ref 1500–7800)
Neutrophils Relative %: 61.7 %
Platelets: 209 10*3/uL (ref 140–400)
RBC: 3.59 10*6/uL — ABNORMAL LOW (ref 3.80–5.10)
RDW: 15.4 % — ABNORMAL HIGH (ref 11.0–15.0)
Total Lymphocyte: 23.9 %
WBC: 4.3 10*3/uL (ref 3.8–10.8)

## 2021-05-15 LAB — COMPLETE METABOLIC PANEL WITH GFR
AG Ratio: 1.8 (calc) (ref 1.0–2.5)
ALT: 26 U/L (ref 6–29)
AST: 27 U/L (ref 10–35)
Albumin: 3.9 g/dL (ref 3.6–5.1)
Alkaline phosphatase (APISO): 66 U/L (ref 37–153)
BUN/Creatinine Ratio: 10 (calc) (ref 6–22)
BUN: 11 mg/dL (ref 7–25)
CO2: 29 mmol/L (ref 20–32)
Calcium: 9.5 mg/dL (ref 8.6–10.4)
Chloride: 106 mmol/L (ref 98–110)
Creat: 1.06 mg/dL — ABNORMAL HIGH (ref 0.60–1.00)
Globulin: 2.2 g/dL (calc) (ref 1.9–3.7)
Glucose, Bld: 87 mg/dL (ref 65–99)
Potassium: 3.6 mmol/L (ref 3.5–5.3)
Sodium: 142 mmol/L (ref 135–146)
Total Bilirubin: 0.5 mg/dL (ref 0.2–1.2)
Total Protein: 6.1 g/dL (ref 6.1–8.1)
eGFR: 56 mL/min/{1.73_m2} — ABNORMAL LOW (ref >=60)

## 2021-06-18 ENCOUNTER — Telehealth: Payer: Self-pay

## 2021-06-18 ENCOUNTER — Ambulatory Visit (INDEPENDENT_AMBULATORY_CARE_PROVIDER_SITE_OTHER): Payer: Medicare Other | Admitting: Infectious Disease

## 2021-06-18 ENCOUNTER — Other Ambulatory Visit: Payer: Self-pay

## 2021-06-18 ENCOUNTER — Telehealth: Payer: Self-pay | Admitting: Pharmacist

## 2021-06-18 ENCOUNTER — Other Ambulatory Visit (HOSPITAL_COMMUNITY): Payer: Self-pay

## 2021-06-18 ENCOUNTER — Encounter: Payer: Self-pay | Admitting: Infectious Disease

## 2021-06-18 VITALS — BP 160/74 | HR 76 | Wt 131.6 lb

## 2021-06-18 DIAGNOSIS — H259 Unspecified age-related cataract: Secondary | ICD-10-CM | POA: Diagnosis not present

## 2021-06-18 DIAGNOSIS — B009 Herpesviral infection, unspecified: Secondary | ICD-10-CM | POA: Diagnosis not present

## 2021-06-18 DIAGNOSIS — H9313 Tinnitus, bilateral: Secondary | ICD-10-CM

## 2021-06-18 DIAGNOSIS — H269 Unspecified cataract: Secondary | ICD-10-CM

## 2021-06-18 DIAGNOSIS — A318 Other mycobacterial infections: Secondary | ICD-10-CM | POA: Diagnosis present

## 2021-06-18 DIAGNOSIS — H9193 Unspecified hearing loss, bilateral: Secondary | ICD-10-CM | POA: Diagnosis not present

## 2021-06-18 DIAGNOSIS — M057A Rheumatoid arthritis with rheumatoid factor of other specified site without organ or systems involvement: Secondary | ICD-10-CM | POA: Diagnosis not present

## 2021-06-18 HISTORY — DX: Unspecified cataract: H26.9

## 2021-06-18 NOTE — Progress Notes (Signed)
Subjective:   Chief complaint still persistent tinnitus though hearing loss is resolved now with ringing of her skin lesions with some purulence on 1 site.   Patient ID: Allison Randall, female    DOB: [birth year], 70 y.o.   MRN: [id number]  HPI  Allison Randall is a 70 year old lady who has a past medical history significant for rheumatoid arthritis, hyperlipidemia HSV infection, who had been getting regular treatment with saline injections to treat varicose veins.  The past these had gone without complication but however this past February she says that the typical erythematous areas in her legs that ensue after injection of the saline failed to resolve and several areas on her upper legs in particular became more erythematous and more inflamed.  She was given several rounds of antibiotics and seen by dermatology who ultimately performed a biopsy which came back with granulomatous pathology as well as evidence of HSV faction with inclusion bodies present and stains positive.  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 Delman Cheadle.  M chelonae I susceptibilities are shown before and picture.   The only reasonable options I saw for her would be oral azithromycin 500 mg along with clofazimine 2 tablets once daily.  She started clofazimine and azithromycin.  She seem to be doing pretty well.  Then in June she went to a very loud country music concert and afterwards noticed that she had loss of hearing and also tinnitus this persisted and continues to the present day and she is visibly hard of hearing in the clinic.  She had looked up possibility of azithromycin causing hearing loss and while rare it can indeed do so.  I had heard of this I had counseled her to stop the antibiotics but she wanted to continue at the time since there were not many antibiotic options.   Is continued off antibiotics and her hearing  loss has reversed.  She does still have some tinnitus.  While this is a rare side effect of macrolides it can occur and it is classically accompanied by tinnitus.  Fortunately as predicted off antibiotics her Mycobacterium has begun to cause her problems again and lesions have grown and one has become purulent. She is for cataract surgery in October as well as come concerned about the fact that she still has soft tissue infection.  I assured her this should not affect her cataract surgery.      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    Social History   Socioeconomic History   Marital status: Married    Spouse name: Not on file   Number of children: Not on file   Years of education: Not on file   Highest education level: Not on file  Occupational History   Not on file  Tobacco Use   Smoking status: Former  Types: Cigarettes    Quit date: 11/30/2007    Years since quitting: 13.5   Smokeless tobacco: Never  Substance and Sexual Activity   Alcohol use: No   Drug use: No   Sexual activity: Not on file  Other Topics Concern   Not on file  Social History Narrative   Not on file   Social Determinants of Health   Financial Resource Strain: Not on file  Food Insecurity: Not on file  Transportation Needs: Not on file  Physical Activity: Not on file  Stress: Not on file  Social Connections: Not on file    Allergies  Allergen Reactions   No Known Allergies      Current Outpatient Medications:    AMBULATORY  NON FORMULARY MEDICATION, Take 100 mg by mouth daily. Medication Name: Clofazimine, Disp: 100 capsule, Rfl: 2   atorvastatin (LIPITOR) 20 MG tablet, Take 20 mg by mouth daily at 6 PM., Disp: , Rfl:    Calcium-Vitamin D-Vitamin K [id number] MG-UNT-MCG CHEW, Chew 2 tablets by mouth daily., Disp: , Rfl:    cholecalciferol (VITAMIN D) 1000 UNITS tablet, Take 1,000 Units by mouth daily., Disp: , Rfl:    cycloSPORINE (RESTASIS) 0.05 % ophthalmic emulsion, Place 1 drop into both eyes 2 (two) times daily., Disp: , Rfl:    fexofenadine-pseudoephedrine (ALLEGRA-D 24) 180-240 MG 24 hr tablet, Take 1 tablet by mouth daily., Disp: , Rfl:    folic acid (FOLVITE) 1 MG tablet, Take 1 mg by mouth 2 (two) times daily. , Disp: , Rfl:    Krill Oil Omega-3 300 MG CAPS, Take 1 capsule by mouth daily., Disp: , Rfl:    levothyroxine (SYNTHROID, LEVOTHROID) 112 MCG tablet, Take 112 mcg by mouth daily before breakfast., Disp: , Rfl:    losartan-hydrochlorothiazide (HYZAAR) 100-12.5 MG per tablet, Take 1 tablet by mouth daily., Disp: , Rfl:    Lysine 1000 MG TABS, Take 1,000 mg by mouth 2 (two) times daily., Disp: , Rfl:    methocarbamol (ROBAXIN) 500 MG tablet, Take 1 tablet (500 mg total) by mouth every 6 (six) hours as needed for muscle spasms., Disp: 60 tablet, Rfl: 0   methotrexate (RHEUMATREX) 2.5 MG tablet, Take 25 mg by mouth once a week. Takes 10 tablets Caution:Chemotherapy. Protect from light. Takes on Wednesday, Disp: , Rfl:    Multiple Vitamin (MULITIVITAMIN WITH MINERALS) TABS, Take 1 tablet by mouth daily., Disp: , Rfl:    naproxen sodium (ANAPROX) 220 MG tablet, Take 220 mg by mouth 2 (two) times daily as needed (pain)., Disp: , Rfl:    oxyCODONE-acetaminophen (PERCOCET/ROXICET) 5-325 MG tablet, Take 1-2 tablets by mouth every 4 (four) hours as needed for moderate pain., Disp: 70 tablet, Rfl: 0   Probiotic Product (PROBIOTIC PO), Take 1 tablet by mouth daily., Disp: , Rfl:    Pseudoephedrine HCl (SUDAFED 24  HOUR PO), Take 1 tablet by mouth daily as needed (sinus pressure)., Disp: , Rfl:    valACYclovir (VALTREX) 500 MG tablet, Take 2,000 mg by mouth 2 (two) times daily as needed (cold sores). , Disp: , Rfl:    zolpidem (AMBIEN) 10 MG tablet, Take 2.5 mg by mouth at bedtime as needed for sleep., Disp: , Rfl:    Review of Systems  Constitutional:  Negative for activity change, appetite change, chills, diaphoresis, fatigue, fever and unexpected weight change.  HENT:  Positive for tinnitus. Negative for congestion, rhinorrhea, sinus pressure, sneezing, sore throat and trouble swallowing.   Eyes:  Negative for photophobia and  visual disturbance.  Respiratory:  Negative for cough, chest tightness, shortness of breath, wheezing and stridor.   Cardiovascular:  Negative for chest pain, palpitations and leg swelling.  Gastrointestinal:  Negative for abdominal distention, abdominal pain, anal bleeding, blood in stool, constipation, diarrhea, nausea and vomiting.  Genitourinary:  Negative for difficulty urinating, dysuria, flank pain and hematuria.  Musculoskeletal:  Negative for arthralgias, back pain, gait problem, joint swelling and myalgias.  Skin:  Negative for color change, pallor, rash and wound.  Neurological:  Negative for dizziness, tremors, weakness and light-headedness.  Hematological:  Negative for adenopathy. Does not bruise/bleed easily.  Psychiatric/Behavioral:  Negative for agitation, behavioral problems, confusion, decreased concentration, dysphoric mood and sleep disturbance.       Objective:   Physical Exam Constitutional:      General: She is not in acute distress.    Appearance: Normal appearance. She is well-developed. She is not ill-appearing or diaphoretic.  HENT:     Head: Normocephalic and atraumatic.     Right Ear: Hearing and external ear normal.     Left Ear: Hearing and external ear normal.     Nose: No nasal deformity or rhinorrhea.  Eyes:     General: No scleral  icterus.    Conjunctiva/sclera: Conjunctivae normal.     Right eye: Right conjunctiva is not injected.     Left eye: Left conjunctiva is not injected.     Pupils: Pupils are equal, round, and reactive to light.  Neck:     Vascular: No JVD.  Cardiovascular:     Rate and Rhythm: Normal rate and regular rhythm.     Heart sounds: S1 normal and S2 normal.  Pulmonary:     Effort: No respiratory distress.     Breath sounds: No wheezing.  Abdominal:     General: Bowel sounds are normal. There is no distension.     Palpations: Abdomen is soft.     Tenderness: There is no abdominal tenderness.  Musculoskeletal:        General: Normal range of motion.     Right shoulder: Normal.     Left shoulder: Normal.     Cervical back: Normal range of motion and neck supple.     Right hip: Normal.     Left hip: Normal.     Right knee: Normal.     Left knee: Normal.  Lymphadenopathy:     Head:     Right side of head: No submandibular, preauricular or posterior auricular adenopathy.     Left side of head: No submandibular, preauricular or posterior auricular adenopathy.     Cervical: No cervical adenopathy.     Right cervical: No superficial or deep cervical adenopathy.    Left cervical: No superficial or deep cervical adenopathy.  Skin:    General: Skin is warm and dry.     Coloration: Skin is not pale.     Findings: Rash present. No abrasion, bruising, ecchymosis, erythema or lesion.     Nails: There is no clubbing.  Neurological:     General: No focal deficit present.     Mental Status: She is alert and oriented to person, place, and time.     Sensory: No sensory deficit.     Coordination: Coordination normal.     Gait: Gait normal.  Psychiatric:        Attention and Perception: She is attentive.        Mood and Affect: Mood normal.  Speech: Speech normal.        Behavior: Behavior normal. Behavior is cooperative.        Thought Content: Thought content normal.        Judgment:  Judgment normal.    Lesions that were biopsied and consistent with Mycobacterium infection 03/25/2021:      August 2nd 2022:       06/18/2021:          Hearing loss with tinnitus: This seems very likely to have been due to the macrolide I have updated her allergy list to include azithromycin causing hearing loss and tinnitus.  Mycobacterium chelonae soft tissue infection:  We do need to active agents to treat this.  I do not think IV tigecycline will be tolerable and would rather not be using an IV in her if I do not need it.  Aminoglycosides should never be used in someone who has tinnitus at risk for hearing loss or nephrotoxicity so that is not an option imipenem only had intermediate activity and again I am not enthusiastic about IV therapy  Instead we will give her a loading dose of IV omadacycline at short stay next week and then oral therapy should be proved as a continuation of parenteral therapy and we can continue on p.o. omadacycline  We will have paperwork filled out for prior authorization.  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  Along with review of medical records in preparation for the visit and during the visit and in coordination of her care with ID pharmacy and short stay to obtain IV therapy.

## 2021-06-18 NOTE — Telephone Encounter (Signed)
Patient scheduled with Short stay 9/13 at 11am for omadacycline '200mg'$  1x infusion. Orders faxed to [facility]. No PA needed for IV infusion; PA needed for oral tablets upon completion. Forwarding to pharmacy team for follow up.  Shirely Toren Lorita Officer, RN

## 2021-06-18 NOTE — Telephone Encounter (Signed)
[facility] Patient Advocate Encounter  Prior Authorization for Elesa Hacker has been approved.    PA# [id number] Effective dates: 06/18/21 through 10/12/21  Patients co-pay is $3359.89.   There is no patient assistance for medicare part d members.   [facility] will continue to follow.  Ileene Patrick, [facility] Patient [facility] for Infectious Disease Phone: [phone] Fax:  [phone]

## 2021-06-18 NOTE — Telephone Encounter (Signed)
Counseled patient on Nyzura and clofazimine. She has taken clofazimine for ~2 months prior to stopping her regimen due to experiencing hearing loss/tinnitus with azithromycin. She states she felt fatigued and experienced GI upset while on that combination therapy. Planning to receive first dose of Nyzura in short stay as IV then switch to oral therapy for insurance purposes. Informed Lovelyn to start clofazimine the day she starts Nyzura. She has clofazimine pills leftover from her initial fill from clinic. Also emphasized the importance of always taking both medications and not stopping one as the combination is needed to treat her mycobacterial infection.   Counseled Shemaiah to take her Tyson Dense once daily when she wakes up in the morning so as to avoid taking it with food and with her multivitamin. She will take her clofazimine at lunch time so as to take it with food. Reviewed the importance of wearing sunscreen and protecting her skin from the sun as it will be more photosensitive. Discussed that Tyson Dense may also cause GI upset. Provided her my card and told her to call the pharmacy team if she experiences any extreme side effects or starts any new medications.  Alfonse Spruce, PharmD, CPP Clinical Pharmacist Practitioner Infectious [place] for Infectious Disease

## 2021-06-18 NOTE — Progress Notes (Signed)
Made in error

## 2021-06-19 ENCOUNTER — Other Ambulatory Visit (HOSPITAL_COMMUNITY): Payer: Self-pay

## 2021-06-20 ENCOUNTER — Other Ambulatory Visit (HOSPITAL_COMMUNITY): Payer: Self-pay

## 2021-06-20 ENCOUNTER — Telehealth: Payer: Self-pay

## 2021-06-20 NOTE — Telephone Encounter (Signed)
[facility] Patient Advocate Encounter   Received notification from [employer] that prior authorization for nuzyra is required.  Tier Cost Sharing Exception request.    PA submitted on 06/20/21 Key [id number] Status is pending    [facility] will continue to follow.   Ileene Patrick, [facility] Patient [facility] for Infectious Disease Phone: [phone] Fax:  [phone]

## 2021-06-24 ENCOUNTER — Other Ambulatory Visit (HOSPITAL_COMMUNITY): Payer: Self-pay | Admitting: *Deleted

## 2021-06-24 NOTE — Progress Notes (Signed)
Received call from [facility] saying they had not received the shipment of patient's medication in as of yet and the patient needs to be rescheduled. Called and left voicemail for patient letting her know the above and that I changed her appt to this Wednesday at 1000am.  Also left our number incase she needed to speak with [place].

## 2021-06-25 ENCOUNTER — Other Ambulatory Visit (HOSPITAL_COMMUNITY): Payer: Self-pay

## 2021-06-25 ENCOUNTER — Inpatient Hospital Stay (HOSPITAL_COMMUNITY): Admission: RE | Admit: 2021-06-25 | Payer: Medicare Other | Source: Ambulatory Visit

## 2021-06-26 ENCOUNTER — Other Ambulatory Visit: Payer: Self-pay

## 2021-06-26 ENCOUNTER — Ambulatory Visit (HOSPITAL_COMMUNITY)
Admission: RE | Admit: 2021-06-26 | Discharge: 2021-06-26 | Disposition: A | Discharge status: Home / Self Care | Payer: Medicare Other | Source: Ambulatory Visit | Attending: Infectious Disease | Admitting: Infectious Disease

## 2021-06-26 DIAGNOSIS — A318 Other mycobacterial infections: Secondary | ICD-10-CM | POA: Insufficient documentation

## 2021-06-26 MED ORDER — OMADACYCLINE TOSYLATE 100 MG IV SOLR: 200.0000 mg | Freq: Once | INTRAVENOUS | Status: DC

## 2021-06-26 MED ORDER — SODIUM CHLORIDE 0.9 % IV SOLN
Freq: Once | INTRAVENOUS | Status: AC
  Filled 2021-06-26: qty 0

## 2021-06-27 ENCOUNTER — Telehealth: Payer: Self-pay

## 2021-06-27 ENCOUNTER — Other Ambulatory Visit (HOSPITAL_COMMUNITY): Payer: Self-pay

## 2021-06-27 NOTE — Telephone Encounter (Signed)
[facility] Patient Advocate Encounter  Completed and sent [facility] Patient Assistance application for Allison Randall for this patient who is insured.    Patient is approved 06/27/21 through 06/26/22.  Nuzyra 150 mg quantity of 30 tablets for a 15 day supply will shipped overnight to patient's home via [facility].  Contact # [phone]   Ileene Patrick, [place] Patient [facility] for Infectious Disease Phone: [phone] Fax:  [phone]

## 2021-07-01 MED FILL — Omadacycline Tosylate IV For Soln 100 MG (Base Equivalent): INTRAVENOUS | Qty: 2 | Status: AC

## 2021-07-01 MED FILL — Sodium Chloride IV Soln 0.9%: INTRAVENOUS | Qty: 100 | Status: AC

## 2021-07-02 ENCOUNTER — Other Ambulatory Visit (HOSPITAL_COMMUNITY): Payer: Self-pay

## 2021-07-02 ENCOUNTER — Telehealth: Payer: Self-pay

## 2021-07-02 NOTE — Telephone Encounter (Signed)
[facility] Patient Advocate Encounter  Received notification from [employer] that the request for prior authorization for Elesa Hacker  has been denied. This is not on the patient's drug list , An exception has previously been made to cover this drug Elesa Hacker) for the patient from 06/18/21 through 10/12/21. Drugs not on the list that have been granted an exception to be covered for the patient, do not qualify for additional lowering of the cost sharing amount. Therefore, patient drug is excluded from an exeption to the cost-sharing tier.  FYI...  I can ask the drug rep for some more samples until we can figure out what will be the next plan.  Ileene Patrick, [facility] Patient [facility] for Infectious Disease Phone: [phone] Fax:  [phone]

## 2021-07-03 NOTE — Telephone Encounter (Signed)
Dr. Tommy Medal - we cannot get Allison Randall at a cheaper price through her Medicare. Her copay is <$3,300.   We were able to get her around a month of tablets for free. We are trying to get more from the drug company... but we may need to think of other options. Nuzyra IV in the home?

## 2021-07-04 NOTE — Telephone Encounter (Signed)
She is getting a month's worth and sees you on 10/6. So, we can decide then?

## 2021-07-05 ENCOUNTER — Other Ambulatory Visit: Payer: Self-pay | Admitting: Pharmacist

## 2021-07-05 DIAGNOSIS — A318 Other mycobacterial infections: Secondary | ICD-10-CM

## 2021-07-05 DIAGNOSIS — R110 Nausea: Secondary | ICD-10-CM

## 2021-07-05 MED ORDER — ONDANSETRON HCL 4 MG PO TABS: 4.0000 mg | ORAL_TABLET | Freq: Three times a day (TID) | ORAL | 0 refills | Status: DC | PRN

## 2021-07-05 NOTE — Progress Notes (Signed)
Patient stated she was been belching with her omadacycline for the past couple of days. Today is day 7 of her therapy, and she got very nauseous after her morning dose and then threw up. Agreeable to trying Zofran 4mg  q8hr prn. She said she would take it with her Elesa Hacker so she didn't throw up her medicine. Sent prescription to the [facility] on [address].  Alfonse Spruce, PharmD, CPP Clinical Pharmacist Practitioner Infectious [place] for Infectious Disease

## 2021-07-09 ENCOUNTER — Telehealth: Payer: Self-pay | Admitting: Pharmacist

## 2021-07-09 NOTE — Telephone Encounter (Signed)
How long would you want IV therapy?

## 2021-07-09 NOTE — Telephone Encounter (Signed)
Completed prior authorization for patient's IV omadacycline. Will update encounter when decision has been made.

## 2021-07-10 NOTE — Telephone Encounter (Signed)
PA was approved. Request reference number: [id number]. Approved until 10/12/21. Will let [facility] know.

## 2021-07-10 NOTE — Telephone Encounter (Signed)
That is $122 per dose. Her part D medicare is really crappy.

## 2021-07-10 NOTE — Telephone Encounter (Signed)
If she got omadacycline through home health, it will be $169 per dose, so not doable unfortunately.

## 2021-07-18 ENCOUNTER — Other Ambulatory Visit: Payer: Self-pay

## 2021-07-18 ENCOUNTER — Encounter: Payer: Self-pay | Admitting: Infectious Disease

## 2021-07-18 ENCOUNTER — Other Ambulatory Visit (HOSPITAL_COMMUNITY): Payer: Self-pay

## 2021-07-18 ENCOUNTER — Ambulatory Visit (INDEPENDENT_AMBULATORY_CARE_PROVIDER_SITE_OTHER): Payer: Medicare Other | Admitting: Infectious Disease

## 2021-07-18 VITALS — Wt 135.0 lb

## 2021-07-18 DIAGNOSIS — R110 Nausea: Secondary | ICD-10-CM | POA: Diagnosis not present

## 2021-07-18 DIAGNOSIS — M057A Rheumatoid arthritis with rheumatoid factor of other specified site without organ or systems involvement: Secondary | ICD-10-CM

## 2021-07-18 DIAGNOSIS — A318 Other mycobacterial infections: Secondary | ICD-10-CM

## 2021-07-18 DIAGNOSIS — H9193 Unspecified hearing loss, bilateral: Secondary | ICD-10-CM

## 2021-07-18 DIAGNOSIS — I10 Essential (primary) hypertension: Secondary | ICD-10-CM | POA: Diagnosis not present

## 2021-07-18 DIAGNOSIS — R9431 Abnormal electrocardiogram [ECG] [EKG]: Secondary | ICD-10-CM | POA: Diagnosis not present

## 2021-07-18 DIAGNOSIS — B009 Herpesviral infection, unspecified: Secondary | ICD-10-CM | POA: Diagnosis not present

## 2021-07-18 NOTE — Progress Notes (Signed)
Subjective:   Chief complaint some nausea associated with the Nuzyra   Patient ID: Allison Randall, female    DOB: August 07, 1951, 70 y.o.   MRN: [id number]  HPI  Allison Randall is a 70 year old lady who has a past medical history significant for rheumatoid arthritis, hyperlipidemia HSV infection, who had been getting regular treatment with saline injections to treat varicose veins.  The past these had gone without complication but however this past February Allison Randall says that the typical erythematous areas in her legs that ensue after injection of the saline failed to resolve and several areas on her upper legs in particular became more erythematous and more inflamed.  Allison Randall was given several rounds of antibiotics and seen by dermatology who ultimately performed a biopsy which came back with granulomatous pathology as well as evidence of HSV faction with inclusion bodies present and stains positive.  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 Delman Cheadle.  M chelonae I susceptibilities are shown before and pictured.   The only reasonable options I saw for her would be oral azithromycin 500 mg along with clofazimine 2 tablets once daily.  Allison Randall started clofazimine and azithromycin.  Allison Randall seem to be doing pretty well.  Then in June 2022 Allison Randall went to a very loud country music concert and afterwards noticed that Allison Randall had loss of hearing and also tinnitus this persisted and continues to the present day and Allison Randall is visibly hard of hearing in the clinic.  Allison Randall had looked up possibility of azithromycin causing hearing loss and while rare it can indeed do so.  I had heard of this I had counseled her to stop the antibiotics but Allison Randall wanted to continue at the time since there were not many antibiotic options.  UNFortunately as predicted off antibiotics her Mycobacterium has begun to cause her problems again and lesions have grown and one  has become purulent. Allison Randall is for cataract surgery in October as well as come concerned about the fact that Allison Randall still has soft tissue infection.  I assured her this should not affect her cataract surgery.  We were able to get her an infusion of IV Nuzyra, and now her Medicare drug plan will cover Elesa Hacker though it is still leaving her with $3300 plus co-pay for the first 30 days of tablets.  We were able to get 15 days of tablets from the drug company an additional 15 days which we have given her.  Another option were contemplating was therapy with clofazimine and bedalaquine but I worried about QT prolonging effects of both the clofazamine and bedalaquine.  He also has been on Zofran and had nausea.  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    Social History   Socioeconomic History   Marital status: Married    Spouse name: Not on file   Number of children: Not on file   Years of education: Not on file   Highest education level: Not on file  Occupational History   Not on file  Tobacco Use   Smoking status: Former    Types: Cigarettes    Quit date: 11/30/2007    Years since quitting: 13.6   Smokeless tobacco: Never  Substance and Sexual Activity   Alcohol use: No   Drug use: No   Sexual activity: Not on file  Other  Topics Concern   Not on file  Social History Narrative   Not on file   Social Determinants of Health   Financial Resource Strain: Not on file  Food Insecurity: Not on file  Transportation Needs: Not on file  Physical Activity: Not on file  Stress: Not on file  Social Connections: Not on file    Allergies  Allergen Reactions   Azithromycin Tinitus    Hearing loss and tinnitus   No Known Allergies      Current Outpatient Medications:    AMBULATORY NON FORMULARY MEDICATION, Take 100 mg by mouth daily. Medication Name: Clofazimine, Disp: 100 capsule, Rfl: 2   atorvastatin (LIPITOR) 20 MG tablet, Take 20 mg by mouth daily at 6 PM., Disp: , Rfl:    Calcium-Vitamin D-Vitamin K 272-536-64 MG-UNT-MCG CHEW, Chew 2 tablets by mouth daily., Disp: , Rfl:    cholecalciferol (VITAMIN D) 1000 UNITS tablet, Take 1,000 Units by mouth daily., Disp: , Rfl:    cycloSPORINE (RESTASIS) 0.05 % ophthalmic emulsion, Place 1 drop into both eyes 2 (two) times daily., Disp: , Rfl:    fexofenadine-pseudoephedrine (ALLEGRA-D 24) 180-240 MG 24 hr tablet, Take 1 tablet by mouth daily., Disp: , Rfl:    folic acid (FOLVITE) 1 MG tablet, Take 1 mg by mouth 2 (two) times daily. , Disp: , Rfl:    Krill Oil Omega-3 300 MG CAPS, Take 1 capsule by mouth daily., Disp: , Rfl:    levothyroxine (SYNTHROID, LEVOTHROID) 112 MCG tablet, Take 112 mcg by mouth daily before breakfast., Disp: , Rfl:    losartan-hydrochlorothiazide (HYZAAR) 100-12.5 MG per tablet, Take 1 tablet by mouth daily., Disp: , Rfl:    Lysine 1000 MG TABS, Take 1,000 mg by mouth 2 (two) times daily., Disp: , Rfl:    methocarbamol (ROBAXIN) 500 MG tablet, Take 1 tablet (500 mg total) by mouth every 6 (six) hours as needed for muscle spasms., Disp: 60 tablet, Rfl: 0   methotrexate (RHEUMATREX) 2.5 MG tablet, Take 25 mg by mouth once a week. Takes 10 tablets Caution:Chemotherapy. Protect from light. Takes on Wednesday, Disp: , Rfl:    Multiple Vitamin  (MULITIVITAMIN WITH MINERALS) TABS, Take 1 tablet by mouth daily., Disp: , Rfl:    naproxen sodium (ANAPROX) 220 MG tablet, Take 220 mg by mouth 2 (two) times daily as needed (pain)., Disp: , Rfl:    ondansetron (ZOFRAN) 4 MG tablet, Take 1 tablet (4 mg total) by mouth every 8 (eight) hours as needed for nausea or vomiting., Disp: 20 tablet, Rfl: 0   oxyCODONE-acetaminophen (PERCOCET/ROXICET) 5-325 MG tablet, Take 1-2 tablets by mouth every 4 (four) hours as needed for moderate pain., Disp: 70 tablet, Rfl: 0   Probiotic Product (PROBIOTIC PO), Take 1 tablet by mouth daily., Disp: , Rfl:  Pseudoephedrine HCl (SUDAFED 24 HOUR PO), Take 1 tablet by mouth daily as needed (sinus pressure)., Disp: , Rfl:    valACYclovir (VALTREX) 500 MG tablet, Take 2,000 mg by mouth 2 (two) times daily as needed (cold sores). , Disp: , Rfl:    zolpidem (AMBIEN) 10 MG tablet, Take 2.5 mg by mouth at bedtime as needed for sleep., Disp: , Rfl:    Review of Systems  Constitutional:  Negative for activity change, appetite change, chills, diaphoresis, fatigue, fever and unexpected weight change.  HENT:  Negative for congestion, rhinorrhea, sinus pressure, sneezing, sore throat and trouble swallowing.   Eyes:  Negative for photophobia and visual disturbance.  Respiratory:  Negative for cough, chest tightness, shortness of breath, wheezing and stridor.   Cardiovascular:  Negative for chest pain, palpitations and leg swelling.  Gastrointestinal:  Positive for nausea. Negative for abdominal distention, abdominal pain, anal bleeding, blood in stool, constipation, diarrhea and vomiting.  Genitourinary:  Negative for difficulty urinating, dysuria, flank pain and hematuria.  Musculoskeletal:  Negative for arthralgias, back pain, gait problem, joint swelling and myalgias.  Skin:  Positive for color change. Negative for pallor, rash and wound.  Neurological:  Negative for dizziness, tremors, weakness and light-headedness.   Hematological:  Negative for adenopathy. Does not bruise/bleed easily.  Psychiatric/Behavioral:  Negative for agitation, behavioral problems, confusion, decreased concentration, dysphoric mood and sleep disturbance.       Objective:   Physical Exam Constitutional:      General: Allison Randall is not in acute distress.    Appearance: Normal appearance. Allison Randall is well-developed. Allison Randall is not ill-appearing or diaphoretic.  HENT:     Head: Normocephalic and atraumatic.     Right Ear: Hearing and external ear normal.     Left Ear: Hearing and external ear normal.     Nose: No nasal deformity or rhinorrhea.  Eyes:     General: No scleral icterus.    Conjunctiva/sclera: Conjunctivae normal.     Right eye: Right conjunctiva is not injected.     Left eye: Left conjunctiva is not injected.     Pupils: Pupils are equal, round, and reactive to light.  Neck:     Vascular: No JVD.  Cardiovascular:     Rate and Rhythm: Normal rate and regular rhythm.     Heart sounds: S1 normal and S2 normal.  Pulmonary:     Effort: Pulmonary effort is normal. No respiratory distress.     Breath sounds: No wheezing.  Abdominal:     General: There is no distension.     Palpations: Abdomen is soft.     Tenderness: There is no abdominal tenderness.  Musculoskeletal:        General: Normal range of motion.     Right shoulder: Normal.     Left shoulder: Normal.     Cervical back: Normal range of motion and neck supple.     Right hip: Normal.     Left hip: Normal.     Right knee: Normal.     Left knee: Normal.  Lymphadenopathy:     Head:     Right side of head: No submandibular, preauricular or posterior auricular adenopathy.     Left side of head: No submandibular, preauricular or posterior auricular adenopathy.     Cervical: No cervical adenopathy.     Right cervical: No superficial or deep cervical adenopathy.    Left cervical: No superficial or deep cervical adenopathy.  Skin:    General: Skin is warm  and dry.      Coloration: Skin is not pale.     Findings: No abrasion, bruising, ecchymosis, erythema, lesion or rash.     Nails: There is no clubbing.  Neurological:     General: No focal deficit present.     Mental Status: Allison Randall is alert and oriented to person, place, and time.     Sensory: No sensory deficit.     Coordination: Coordination normal.     Gait: Gait normal.  Psychiatric:        Attention and Perception: Allison Randall is attentive.        Mood and Affect: Mood normal.        Speech: Speech normal.        Behavior: Behavior normal. Behavior is cooperative.        Thought Content: Thought content normal.        Judgment: Judgment normal.    Lesions that were biopsied and consistent with Mycobacterium infection 03/25/2021:      August 2nd 2022:       06/18/2021:        07/18/2021:       A/P  M chelonae infection:  I have some concern about elevated QT interval and risk for further QT prolongation if we were to use bedalaquine.  For now will continue [place] and have another 15 day supply to give to her  We will check her electrolytes and see if Allison Randall has had any problems with hypokalemia or other reversible causes of QT prolongation  If so we will correct them and repeat EKG with corrected potassium  I think however though if QTC and QT are still an issue would be better to err on the side of caution and proceed with her current regimen of clofazimine and Nuzyra.  Allison Randall would have to pay $3300 at least for the first month, hopefully with drop-off in her co-pay in the months thereafter.  Another option with thought about his intravenous tigecycline which would also be roughly $1700 for the first month out-of-pocket but require a PICC line and home health and monitoring of labs.  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   HTN on medications     I spent 42  minutes with the patient including  face to face counseling of the patient adding the nature of her soft tissue infection with nontuberculous mycobacteria how we could treat it with the remaining antibiotics left at our disposal, along with unfortunate high cost of these antibiotics viewing her antimicrobial susceptibilities yet again her cost for various antibiotics, toxicities of other ones that make them prohibitive, personally reviewing her EKG which showed normal sinus rhythm with QT and QTc as mentioned above along with review of medical records in preparation for the visit and during the visit and in coordination of her care with [facility].

## 2021-07-19 LAB — CBC WITH DIFFERENTIAL/PLATELET
Absolute Monocytes: 589 cells/uL (ref 200–950)
Basophils Absolute: 11 cells/uL (ref 0–200)
Basophils Relative: 0.2 %
Eosinophils Absolute: 49 cells/uL (ref 15–500)
Eosinophils Relative: 0.9 %
HCT: 37.3 % (ref 35.0–45.0)
Hemoglobin: 12.8 g/dL (ref 11.7–15.5)
Lymphs Abs: 778 cells/uL — ABNORMAL LOW (ref 850–3900)
MCH: 33.5 pg — ABNORMAL HIGH (ref 27.0–33.0)
MCHC: 34.3 g/dL (ref 32.0–36.0)
MCV: 97.6 fL (ref 80.0–100.0)
MPV: 9 fL (ref 7.5–12.5)
Monocytes Relative: 10.9 %
Neutro Abs: 3974 cells/uL (ref 1500–7800)
Neutrophils Relative %: 73.6 %
Platelets: 150 10*3/uL (ref 140–400)
RBC: 3.82 10*6/uL (ref 3.80–5.10)
RDW: 14.5 % (ref 11.0–15.0)
Total Lymphocyte: 14.4 %
WBC: 5.4 10*3/uL (ref 3.8–10.8)

## 2021-07-19 LAB — COMPLETE METABOLIC PANEL WITH GFR
AG Ratio: 2 (calc) (ref 1.0–2.5)
ALT: 21 U/L (ref 6–29)
AST: 23 U/L (ref 10–35)
Albumin: 3.8 g/dL (ref 3.6–5.1)
Alkaline phosphatase (APISO): 68 U/L (ref 37–153)
BUN: 15 mg/dL (ref 7–25)
CO2: 29 mmol/L (ref 20–32)
Calcium: 9.3 mg/dL (ref 8.6–10.4)
Chloride: 104 mmol/L (ref 98–110)
Creat: 0.89 mg/dL (ref 0.60–1.00)
Globulin: 1.9 g/dL (calc) (ref 1.9–3.7)
Glucose, Bld: 81 mg/dL (ref 65–99)
Potassium: 3.6 mmol/L (ref 3.5–5.3)
Sodium: 142 mmol/L (ref 135–146)
Total Bilirubin: 0.5 mg/dL (ref 0.2–1.2)
Total Protein: 5.7 g/dL — ABNORMAL LOW (ref 6.1–8.1)
eGFR: 69 mL/min/{1.73_m2} (ref >=60)

## 2021-07-23 ENCOUNTER — Other Ambulatory Visit: Payer: Self-pay | Admitting: Pharmacist

## 2021-07-23 DIAGNOSIS — R110 Nausea: Secondary | ICD-10-CM

## 2021-07-23 MED ORDER — ONDANSETRON HCL 4 MG PO TABS: 4.0000 mg | ORAL_TABLET | Freq: Three times a day (TID) | ORAL | 2 refills | Status: DC | PRN

## 2021-07-29 ENCOUNTER — Telehealth: Payer: Self-pay | Admitting: Pharmacist

## 2021-07-29 MED ORDER — OMADACYCLINE TOSYLATE 150 MG PO TABS: 300.0000 mg | ORAL_TABLET | Freq: Every day | ORAL | 5 refills | Status: DC

## 2021-07-29 NOTE — Telephone Encounter (Signed)
We were able to access 2 more weeks of Nuzyra samples for patient.

## 2021-08-02 ENCOUNTER — Telehealth: Payer: Self-pay

## 2021-08-02 ENCOUNTER — Other Ambulatory Visit: Payer: Self-pay

## 2021-08-02 ENCOUNTER — Ambulatory Visit (INDEPENDENT_AMBULATORY_CARE_PROVIDER_SITE_OTHER): Payer: Medicare Other | Admitting: Infectious Disease

## 2021-08-02 ENCOUNTER — Encounter: Payer: Self-pay | Admitting: Infectious Disease

## 2021-08-02 VITALS — BP 157/88 | HR 86 | Temp 98.2°F | Ht 64.0 in | Wt 135.0 lb

## 2021-08-02 DIAGNOSIS — R9431 Abnormal electrocardiogram [ECG] [EKG]: Secondary | ICD-10-CM

## 2021-08-02 DIAGNOSIS — H9193 Unspecified hearing loss, bilateral: Secondary | ICD-10-CM

## 2021-08-02 DIAGNOSIS — I10 Essential (primary) hypertension: Secondary | ICD-10-CM | POA: Diagnosis not present

## 2021-08-02 DIAGNOSIS — H9313 Tinnitus, bilateral: Secondary | ICD-10-CM | POA: Diagnosis not present

## 2021-08-02 DIAGNOSIS — B009 Herpesviral infection, unspecified: Secondary | ICD-10-CM

## 2021-08-02 DIAGNOSIS — M057A Rheumatoid arthritis with rheumatoid factor of other specified site without organ or systems involvement: Secondary | ICD-10-CM | POA: Diagnosis not present

## 2021-08-02 DIAGNOSIS — A318 Other mycobacterial infections: Secondary | ICD-10-CM

## 2021-08-02 HISTORY — DX: Abnormal electrocardiogram (ECG) (EKG): R94.31

## 2021-08-02 NOTE — Progress Notes (Signed)
Subjective:   Chief complaints: Still with some hearing loss she believes   Patient ID: Allison Randall, female    DOB: [birth year], 70 y.o.   MRN: [id number]  HPI  Tamina is a 70 year old lady who has a past medical history significant for rheumatoid arthritis, hyperlipidemia HSV infection, who had been getting regular treatment with saline injections to treat varicose veins.  The past these had gone without complication but however this past February she says that the typical erythematous areas in her legs that ensue after injection of the saline failed to resolve and several areas on her upper legs in particular became more erythematous and more inflamed.  She was given several rounds of antibiotics and seen by dermatology who ultimately performed a biopsy which came back with granulomatous pathology as well as evidence of HSV faction with inclusion bodies present and stains positive.  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 Delman Cheadle.  M chelonae I susceptibilities are shown before and pictured.   The only reasonable options I saw at that time for her would be oral azithromycin 500 mg along with clofazimine 2 tablets once daily.  She started clofazimine and azithromycin.  She seem to be doing pretty well.  Then in June 2022 she went to a very loud country music concert and afterwards noticed that she had loss of hearing and also tinnitus this persisted and continues to the present day and she is visibly hard of hearing in the clinic.  She had looked up possibility of azithromycin causing hearing loss and while rare it can indeed do so.  I had heard of this I had counseled her to stop the antibiotics but she wanted to continue at the time since there were not many antibiotic options.  UNFortunately as predicted off antibiotics her Mycobacterium has begun to cause her problems again and lesions  have grown and one has become purulent. She is for cataract surgery in October as well as come concerned about the fact that she still has soft tissue infection.  I assured her this should not affect her cataract surgery.  We were able to get her an infusion of IV Nuzyra, and now her Medicare drug plan will cover Elesa Hacker though it is still leaving her with $3300 plus co-pay for the first 30 days of tablets.  We were able to get 15 days of tablets from the drug company an additional 15 days which we have given her.  Another option were contemplating was therapy with clofazimine and bedalaquine but I worried about QT prolonging effects of both the clofazamine and bedalaquine.  He also has been on Zofran and had nausea.  We obtained a twelve-lead EKG at last visit which showed a QT that was prolonged to 422 ms and a QTC of 462 ms with normal sinus rhythm and some preventricular ventricular contractions  Turn to clinic for follow-up today and we obtained a repeat EKG which now shows a QT of 387 and a QTC of 426 ms with normal sinus rhythm.  She is tolerating the nuzyra with zofran premedication.  She has seen one of the areas that become more purulent improve on the therapy.  Her hearing is returned to near normal but not completely normal.  She still has some difficulty for example understanding our staff behind the [place].        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    Social History    Socioeconomic History   Marital status: Married    Spouse name: Not on file   Number of children: Not on file   Years of education: Not on file   Highest education level: Not on file  Occupational History   Not on file  Tobacco Use   Smoking status: Former    Types: Cigarettes    Quit date: 11/30/2007    Years since quitting: 13.6   Smokeless tobacco: Never  Substance and Sexual Activity   Alcohol use: No   Drug use: No   Sexual activity: Not on file  Other Topics Concern   Not on file  Social History Narrative   Not on file   Social Determinants of Health   Financial Resource Strain: Not on file  Food Insecurity: Not on file  Transportation Needs: Not on file  Physical Activity: Not on file  Stress: Not on file  Social Connections: Not on file    Allergies  Allergen Reactions   Azithromycin Tinitus    Hearing loss and tinnitus   No Known Allergies      Current Outpatient Medications:    AMBULATORY NON FORMULARY MEDICATION, Take 100 mg by mouth daily. Medication Name: Clofazimine, Disp: 100 capsule, Rfl: 2   atorvastatin (LIPITOR) 20 MG tablet, Take 20 mg by mouth daily at 6 PM., Disp: , Rfl:    Calcium-Vitamin D-Vitamin K 161-096-04 MG-UNT-MCG CHEW, Chew 2 tablets by mouth daily., Disp: , Rfl:    cholecalciferol (VITAMIN D) 1000 UNITS tablet, Take 1,000 Units by mouth daily., Disp: , Rfl:    cycloSPORINE (RESTASIS) 0.05 % ophthalmic emulsion, Place 1 drop into both eyes 2 (two) times daily., Disp: , Rfl:    folic acid (FOLVITE) 1 MG tablet, Take 1 mg by mouth 2 (two) times daily. , Disp: , Rfl:    Krill Oil Omega-3 300 MG CAPS, Take 1 capsule by mouth daily., Disp: , Rfl:    levothyroxine (SYNTHROID, LEVOTHROID) 112 MCG tablet, Take 112 mcg by mouth daily before breakfast., Disp: , Rfl:    losartan-hydrochlorothiazide (HYZAAR) 100-12.5 MG per tablet, Take 1 tablet by mouth daily., Disp: , Rfl:    Lysine 1000 MG TABS, Take 1,000 mg by mouth 2 (two) times daily.,  Disp: , Rfl:    methocarbamol (ROBAXIN) 500 MG tablet, Take 1 tablet (500 mg total) by mouth every 6 (six) hours as needed for muscle spasms., Disp: 60 tablet, Rfl: 0   moxifloxacin (VIGAMOX) 0.5 % ophthalmic solution, Place 1 drop into the left eye 4 (four) times daily., Disp: , Rfl:    Multiple Vitamin (MULITIVITAMIN WITH MINERALS) TABS, Take 1 tablet by mouth daily., Disp: , Rfl:    naproxen sodium (ANAPROX) 220 MG tablet, Take 220 mg by mouth 2 (two) times daily as needed (pain)., Disp: , Rfl:    Omadacycline Tosylate 150 MG TABS, Take  300 mg by mouth daily., Disp: 28 tablet, Rfl: 5   ondansetron (ZOFRAN) 4 MG tablet, Take 1 tablet (4 mg total) by mouth every 8 (eight) hours as needed for nausea or vomiting., Disp: 40 tablet, Rfl: 2   prednisoLONE acetate (PRED FORTE) 1 % ophthalmic suspension, Place 1 drop into the left eye 4 (four) times daily., Disp: , Rfl:    Probiotic Product (PROBIOTIC PO), Take 1 tablet by mouth daily., Disp: , Rfl:    Pseudoephedrine HCl (SUDAFED 24 HOUR PO), Take 1 tablet by mouth daily as needed (sinus pressure)., Disp: , Rfl:    valACYclovir (VALTREX) 500 MG tablet, Take 2,000 mg by mouth 2 (two) times daily as needed (cold sores). , Disp: , Rfl:    zolpidem (AMBIEN) 10 MG tablet, Take 2.5 mg by mouth at bedtime as needed for sleep., Disp: , Rfl:    fexofenadine-pseudoephedrine (ALLEGRA-D 24) 180-240 MG 24 hr tablet, Take 1 tablet by mouth daily. (Patient not taking: Reported on 08/02/2021), Disp: , Rfl:    methotrexate (RHEUMATREX) 2.5 MG tablet, Take 25 mg by mouth once a week. Takes 10 tablets Caution:Chemotherapy. Protect from light. Takes on Wednesday (Patient not taking: Reported on 08/02/2021), Disp: , Rfl:    oxyCODONE-acetaminophen (PERCOCET/ROXICET) 5-325 MG tablet, Take 1-2 tablets by mouth every 4 (four) hours as needed for moderate pain. (Patient not taking: Reported on 08/02/2021), Disp: 70 tablet, Rfl: 0   Review of Systems  Constitutional:  Negative  for activity change, appetite change, chills, diaphoresis, fatigue, fever and unexpected weight change.  HENT:  Positive for hearing loss. Negative for congestion, rhinorrhea, sinus pressure, sneezing, sore throat and trouble swallowing.   Eyes:  Negative for photophobia and visual disturbance.  Respiratory:  Negative for cough, chest tightness, shortness of breath, wheezing and stridor.   Cardiovascular:  Negative for chest pain, palpitations and leg swelling.  Gastrointestinal:  Negative for abdominal distention, abdominal pain, anal bleeding, blood in stool, constipation, diarrhea, nausea and vomiting.  Genitourinary:  Negative for difficulty urinating, dysuria, flank pain and hematuria.  Musculoskeletal:  Negative for arthralgias, back pain, gait problem, joint swelling and myalgias.  Skin:  Positive for rash. Negative for color change, pallor and wound.  Neurological:  Negative for dizziness, tremors, weakness and light-headedness.  Hematological:  Negative for adenopathy. Does not bruise/bleed easily.  Psychiatric/Behavioral:  Negative for agitation, behavioral problems, confusion, decreased concentration, dysphoric mood and sleep disturbance.       Objective:   Physical Exam Constitutional:      General: She is not in acute distress.    Appearance: Normal appearance. She is well-developed. She is not ill-appearing or diaphoretic.  HENT:     Head: Normocephalic and atraumatic.     Right Ear: Hearing and external ear normal.     Left Ear: Hearing and external ear normal.     Nose: No nasal deformity or rhinorrhea.  Eyes:     General: No scleral icterus.    Conjunctiva/sclera: Conjunctivae normal.     Right eye: Right conjunctiva is not injected.     Left eye: Left conjunctiva is not injected.     Pupils: Pupils are equal, round, and reactive to light.  Neck:     Vascular: No JVD.  Cardiovascular:     Rate and Rhythm: Normal rate and regular rhythm.     Heart sounds: Normal  heart sounds, S1 normal and S2 normal. No murmur heard.   No friction rub.  Abdominal:     General:  Bowel sounds are normal. There is no distension.     Palpations: Abdomen is soft.     Tenderness: There is no abdominal tenderness.  Musculoskeletal:        General: Normal range of motion.     Right shoulder: Normal.     Left shoulder: Normal.     Cervical back: Normal range of motion and neck supple.     Right hip: Normal.     Left hip: Normal.     Right knee: Normal.     Left knee: Normal.  Lymphadenopathy:     Head:     Right side of head: No submandibular, preauricular or posterior auricular adenopathy.     Left side of head: No submandibular, preauricular or posterior auricular adenopathy.     Cervical: No cervical adenopathy.     Right cervical: No superficial or deep cervical adenopathy.    Left cervical: No superficial or deep cervical adenopathy.  Skin:    General: Skin is warm and dry.     Coloration: Skin is not pale.     Findings: No abrasion, bruising, ecchymosis, erythema, lesion or rash.     Nails: There is no clubbing.  Neurological:     General: No focal deficit present.     Mental Status: She is alert and oriented to person, place, and time.     Sensory: No sensory deficit.     Coordination: Coordination normal.     Gait: Gait normal.  Psychiatric:        Attention and Perception: She is attentive.        Mood and Affect: Mood normal.        Speech: Speech normal.        Behavior: Behavior normal. Behavior is cooperative.        Thought Content: Thought content normal.        Judgment: Judgment normal.    Lesions that were biopsied and consistent with Mycobacterium infection 03/25/2021:      August 2nd 2022:       06/18/2021:        07/18/2021:      08/02/2021:  Right     Left leg     A/P  Mycobacteria chelonae soft tissue infection  We will give her additional clofazamine and thankfully we were able to procure another 15  days of Nuzyra. Hopefully we can continue to procure Elesa Hacker that way other than have her have to pay $3000 out-of-pocket every month for several months.  Agree with continue to hold methotrexate.  We run to problems with the cost of Elesa Hacker can try clofazamine and bedalaquine now that we are seeing normal QT  HSV infection: was superinfection of her Mycobacterial infection  Hypertension: BP up  Vitals:   08/02/21 1042  BP: (!) 157/88  Pulse: 86  Temp: 98.2 F (36.8 C)  SpO2: 99%      QT prolongation: This is normalized have personally reviewed the EKG which shows normal sinus rhythm with no significant ST or T wave changes and with a QT of 387 and QTC of 426.  We also recheck a metabolic panel and faxed to rheumatology.  Arthritis continue to hold methotrexate  Hearing loss that seems to have greatly improved with discontinuing her azithromycin though she still has some tinnitus and some residual hearing loss.  I spent 41 minutes with the patient including  face to face counseling of the patient personally reviewing her EKGs her metabolic panel, along with, review  of medical records in preparation for the visit and during the visit and in coordination of her care.

## 2021-08-02 NOTE — Telephone Encounter (Signed)
Two bottles of clofazamine in triage for patient to pick up. Called patient to let her know, no answer. Left HIPAA compliant voicemail requesting callback.   Beryle Flock, RN

## 2021-08-03 LAB — COMPLETE METABOLIC PANEL WITH GFR
AG Ratio: 2 (calc) (ref 1.0–2.5)
ALT: 23 U/L (ref 6–29)
AST: 27 U/L (ref 10–35)
Albumin: 3.9 g/dL (ref 3.6–5.1)
Alkaline phosphatase (APISO): 76 U/L (ref 37–153)
BUN: 10 mg/dL (ref 7–25)
CO2: 28 mmol/L (ref 20–32)
Calcium: 9.1 mg/dL (ref 8.6–10.4)
Chloride: 105 mmol/L (ref 98–110)
Creat: 0.98 mg/dL (ref 0.60–1.00)
Globulin: 2 g/dL (calc) (ref 1.9–3.7)
Glucose, Bld: 83 mg/dL (ref 65–99)
Potassium: 3.7 mmol/L (ref 3.5–5.3)
Sodium: 144 mmol/L (ref 135–146)
Total Bilirubin: 0.6 mg/dL (ref 0.2–1.2)
Total Protein: 5.9 g/dL — ABNORMAL LOW (ref 6.1–8.1)
eGFR: 61 mL/min/{1.73_m2} (ref >=60)

## 2021-08-03 LAB — CBC WITH DIFFERENTIAL/PLATELET
Absolute Monocytes: 692 cells/uL (ref 200–950)
Basophils Absolute: 42 cells/uL (ref 0–200)
Basophils Relative: 0.8 %
Eosinophils Absolute: 140 cells/uL (ref 15–500)
Eosinophils Relative: 2.7 %
HCT: 39.8 % (ref 35.0–45.0)
Hemoglobin: 13.5 g/dL (ref 11.7–15.5)
Lymphs Abs: 1851 cells/uL (ref 850–3900)
MCH: 32.3 pg (ref 27.0–33.0)
MCHC: 33.9 g/dL (ref 32.0–36.0)
MCV: 95.2 fL (ref 80.0–100.0)
MPV: 8.9 fL (ref 7.5–12.5)
Monocytes Relative: 13.3 %
Neutro Abs: 2475 cells/uL (ref 1500–7800)
Neutrophils Relative %: 47.6 %
Platelets: 232 10*3/uL (ref 140–400)
RBC: 4.18 10*6/uL (ref 3.80–5.10)
RDW: 14.1 % (ref 11.0–15.0)
Total Lymphocyte: 35.6 %
WBC: 5.2 10*3/uL (ref 3.8–10.8)

## 2021-08-06 NOTE — Telephone Encounter (Signed)
Patient picked up two bottles of clofazamine.   Beryle Flock, RN

## 2021-08-20 ENCOUNTER — Other Ambulatory Visit (HOSPITAL_COMMUNITY): Payer: Self-pay

## 2021-09-10 ENCOUNTER — Other Ambulatory Visit (HOSPITAL_COMMUNITY): Payer: Self-pay

## 2021-09-25 ENCOUNTER — Other Ambulatory Visit (HOSPITAL_COMMUNITY): Payer: Self-pay

## 2021-09-27 ENCOUNTER — Telehealth: Payer: Self-pay

## 2021-09-27 ENCOUNTER — Ambulatory Visit (INDEPENDENT_AMBULATORY_CARE_PROVIDER_SITE_OTHER): Payer: Medicare Other | Admitting: Infectious Disease

## 2021-09-27 ENCOUNTER — Encounter: Payer: Self-pay | Admitting: Infectious Disease

## 2021-09-27 ENCOUNTER — Other Ambulatory Visit: Payer: Self-pay

## 2021-09-27 VITALS — BP 153/81 | HR 82 | Resp 16 | Ht 64.0 in | Wt 136.0 lb

## 2021-09-27 DIAGNOSIS — A318 Other mycobacterial infections: Secondary | ICD-10-CM | POA: Diagnosis present

## 2021-09-27 DIAGNOSIS — B009 Herpesviral infection, unspecified: Secondary | ICD-10-CM | POA: Diagnosis not present

## 2021-09-27 DIAGNOSIS — M057A Rheumatoid arthritis with rheumatoid factor of other specified site without organ or systems involvement: Secondary | ICD-10-CM | POA: Diagnosis not present

## 2021-09-27 DIAGNOSIS — I10 Essential (primary) hypertension: Secondary | ICD-10-CM

## 2021-09-27 DIAGNOSIS — H9193 Unspecified hearing loss, bilateral: Secondary | ICD-10-CM | POA: Diagnosis not present

## 2021-09-27 DIAGNOSIS — R9431 Abnormal electrocardiogram [ECG] [EKG]: Secondary | ICD-10-CM

## 2021-09-27 NOTE — Telephone Encounter (Signed)
Notes and labs sent to DR [facility] upon patients request.

## 2021-09-27 NOTE — Progress Notes (Signed)
Subjective:   Chief complaints: Follow-up for mycobacterial infection   Patient ID: Allison Randall, female    DOB: 1951/08/28, 70 y.o.   MRN: [id number]  HPI  Via is a 70 year old lady who has a past medical history significant for rheumatoid arthritis, hyperlipidemia HSV infection, who had been getting regular treatment with saline injections to treat varicose veins.  The past these had gone without complication but however this past February she says that the typical erythematous areas in her legs that ensue after injection of the saline failed to resolve and several areas on her upper legs in particular became more erythematous and more inflamed.  She was given several rounds of antibiotics and seen by dermatology who ultimately performed a biopsy which came back with granulomatous pathology as well as evidence of HSV faction with inclusion bodies present and stains positive.  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 Delman Cheadle.  M chelonae I susceptibilities are shown before and pictured.   The only reasonable options I saw at that time for her would be oral azithromycin 500 mg along with clofazimine 2 tablets once daily.  She started clofazimine and azithromycin.  She seem to be doing pretty well.  Then in June 2022 she went to a very loud country music concert and afterwards noticed that she had loss of hearing and also tinnitus this persisted and continues to the present day and she is visibly hard of hearing in the clinic.  She had looked up possibility of azithromycin causing hearing loss and while rare it can indeed do so.  I had heard of this I had counseled her to stop the antibiotics but she wanted to continue at the time since there were not many antibiotic options.  UNFortunately as predicted off antibiotics her Mycobacterium had begun to cause her problems again and lesions have  grown and one has become purulent. She is for cataract surgery in October as well as come concerned about the fact that she still has soft tissue infection.  I assured her this should not affect her cataract surgery.  We were able to get her an infusion of IV Nuzyra, and now her Medicare drug plan will cover Elesa Hacker though it is still leaving her with $3300 plus co-pay for the first 30 days of tablets.  We were able to get 15 days of tablets from the drug company an additional 15 days which we have given her.  Another option were contemplating was therapy with clofazimine and bedalaquine but I worried about QT prolonging effects of both the clofazamine and bedalaquine.  She also has been on Zofran and had nausea.  We obtained a twelve-lead EKG at last visit which showed a QT that was prolonged to 422 ms and a QTC of 462 ms with normal sinus rhythm and some preventricular ventricular contractions  Turn to clinic for follow-up today and we obtained a repeat EKG which now shows a QT of 387 and a QTC of 426 ms with normal sinus rhythm.  Since I last saw [name] she is continue to have improvement in these areas on her legs.  We have gotten through nearly 3 months of therapy.  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    Social History   Socioeconomic History   Marital status: Married    Spouse name: Not on file   Number of  children: Not on file   Years of education: Not on file   Highest education level: Not on file  Occupational History   Not on file  Tobacco Use   Smoking status: Former    Types: Cigarettes    Quit date: 11/30/2007    Years since quitting: 13.8   Smokeless tobacco: Never  Substance and Sexual Activity   Alcohol use: No   Drug use: No   Sexual activity: Not on file  Other Topics Concern   Not on file  Social History Narrative   Not on file   Social Determinants of Health   Financial Resource Strain: Not on file  Food Insecurity: Not on file  Transportation Needs: Not on file  Physical Activity: Not on file  Stress: Not on file  Social Connections: Not on file    Allergies  Allergen Reactions   Azithromycin Tinitus    Hearing loss and tinnitus   No Known Allergies      Current Outpatient Medications:    AMBULATORY NON FORMULARY MEDICATION, Take 100 mg by mouth daily. Medication Name: Clofazimine, Disp: 100 capsule, Rfl: 2   atorvastatin (LIPITOR) 20 MG tablet, Take 20 mg by mouth daily at 6 PM., Disp: , Rfl:    Calcium-Vitamin D-Vitamin K 606-301-60 MG-UNT-MCG CHEW, Chew 2 tablets by mouth daily., Disp: , Rfl:    cholecalciferol (VITAMIN D) 1000 UNITS tablet, Take 1,000 Units by mouth daily., Disp: , Rfl:    cycloSPORINE (RESTASIS) 0.05 % ophthalmic emulsion, Place 1 drop into both eyes 2 (two) times daily., Disp: , Rfl:    fexofenadine-pseudoephedrine (ALLEGRA-D 24) 180-240 MG 24 hr tablet, Take 1 tablet by mouth daily., Disp: , Rfl:    folic acid (FOLVITE) 1 MG tablet, Take 1 mg by mouth 2 (two) times daily. , Disp: , Rfl:    Krill Oil Omega-3 300 MG CAPS, Take 1 capsule by mouth daily., Disp: , Rfl:    levothyroxine (SYNTHROID, LEVOTHROID) 112 MCG tablet, Take 112 mcg by mouth daily before breakfast., Disp: , Rfl:    losartan-hydrochlorothiazide (HYZAAR) 100-12.5 MG per tablet, Take 1 tablet by mouth daily., Disp: , Rfl:    Lysine 1000 MG TABS, Take 1,000 mg by mouth  2 (two) times daily., Disp: , Rfl:    methocarbamol (ROBAXIN) 500 MG tablet, Take 1 tablet (500 mg total) by mouth every 6 (six) hours as needed for muscle spasms., Disp: 60 tablet, Rfl: 0   methotrexate (RHEUMATREX) 2.5 MG tablet, Take 25 mg by mouth once a week. Takes 10 tablets Caution:Chemotherapy. Protect from light. Takes on Wednesday, Disp: , Rfl:    moxifloxacin (VIGAMOX) 0.5 % ophthalmic solution, Place 1 drop into the left eye 4 (four) times daily., Disp: , Rfl:    Multiple Vitamin (MULITIVITAMIN WITH MINERALS) TABS, Take 1 tablet by mouth daily.,  Disp: , Rfl:    naproxen sodium (ANAPROX) 220 MG tablet, Take 220 mg by mouth 2 (two) times daily as needed (pain)., Disp: , Rfl:    Omadacycline Tosylate 150 MG TABS, Take 300 mg by mouth daily., Disp: 28 tablet, Rfl: 5   ondansetron (ZOFRAN) 4 MG tablet, Take 1 tablet (4 mg total) by mouth every 8 (eight) hours as needed for nausea or vomiting., Disp: 40 tablet, Rfl: 2   oxyCODONE-acetaminophen (PERCOCET/ROXICET) 5-325 MG tablet, Take 1-2 tablets by mouth every 4 (four) hours as needed for moderate pain., Disp: 70 tablet, Rfl: 0   prednisoLONE acetate (PRED FORTE) 1 % ophthalmic suspension, Place 1 drop into the left eye 4 (four) times daily., Disp: , Rfl:    Probiotic Product (PROBIOTIC PO), Take 1 tablet by mouth daily., Disp: , Rfl:    Pseudoephedrine HCl (SUDAFED 24 HOUR PO), Take 1 tablet by mouth daily as needed (sinus pressure)., Disp: , Rfl:    valACYclovir (VALTREX) 500 MG tablet, Take 2,000 mg by mouth 2 (two) times daily as needed (cold sores). , Disp: , Rfl:    zolpidem (AMBIEN) 10 MG tablet, Take 2.5 mg by mouth at bedtime as needed for sleep., Disp: , Rfl:    Review of Systems  Constitutional:  Negative for activity change, appetite change, chills, diaphoresis, fatigue, fever and unexpected weight change.  HENT:  Negative for congestion, rhinorrhea, sinus pressure, sneezing, sore throat and trouble swallowing.   Eyes:  Negative  for photophobia and visual disturbance.  Respiratory:  Negative for cough, chest tightness, shortness of breath, wheezing and stridor.   Cardiovascular:  Negative for chest pain, palpitations and leg swelling.  Gastrointestinal:  Negative for abdominal distention, abdominal pain, anal bleeding, blood in stool, constipation, diarrhea, nausea and vomiting.  Genitourinary:  Negative for difficulty urinating, dysuria, flank pain and hematuria.  Musculoskeletal:  Negative for arthralgias, back pain, gait problem, joint swelling and myalgias.  Skin:  Negative for color change, pallor, rash and wound.  Neurological:  Negative for dizziness, tremors, weakness and light-headedness.  Hematological:  Negative for adenopathy. Does not bruise/bleed easily.  Psychiatric/Behavioral:  Negative for agitation, behavioral problems, confusion, decreased concentration, dysphoric mood and sleep disturbance.       Objective:   Physical Exam Constitutional:      General: She is not in acute distress.    Appearance: Normal appearance. She is well-developed. She is not ill-appearing or diaphoretic.  HENT:     Head: Normocephalic and atraumatic.     Right Ear: Hearing and external ear normal.     Left Ear: Hearing and external ear normal.     Nose: No nasal deformity or rhinorrhea.  Eyes:     General: No scleral icterus.    Conjunctiva/sclera: Conjunctivae normal.     Right eye: Right conjunctiva is not injected.     Left eye: Left conjunctiva is not injected.     Pupils: Pupils are equal, round, and reactive to light.  Neck:     Vascular: No JVD.  Cardiovascular:     Rate and Rhythm: Normal rate and regular rhythm.     Heart sounds: S1 normal and S2 normal.  Pulmonary:     Effort: Pulmonary effort is normal. No respiratory distress.     Breath sounds: No wheezing.  Abdominal:     General: Bowel sounds are normal. There is no distension.     Palpations: Abdomen is soft.     Tenderness: There is no  abdominal tenderness.  Musculoskeletal:        General: Normal range of motion.     Right shoulder: Normal.     Left shoulder: Normal.     Cervical back: Normal range of motion and neck supple.     Right hip: Normal.     Left hip: Normal.     Right knee: Normal.     Left knee: Normal.  Lymphadenopathy:     Head:     Right side of head: No submandibular, preauricular or posterior auricular adenopathy.     Left side of head: No submandibular, preauricular or posterior auricular adenopathy.     Cervical: No cervical adenopathy.     Right cervical: No superficial or deep cervical adenopathy.    Left cervical: No superficial or deep cervical adenopathy.  Skin:    General: Skin is warm and dry.     Coloration: Skin is not pale.     Findings: No abrasion, bruising, ecchymosis, erythema, lesion or rash.     Nails: There is no clubbing.  Neurological:     General: No focal deficit present.     Mental Status: She is alert and oriented to person, place, and time.     Sensory: No sensory deficit.     Coordination: Coordination normal.     Gait: Gait normal.  Psychiatric:        Attention and Perception: She is attentive.        Mood and Affect: Mood normal.        Speech: Speech normal.        Behavior: Behavior normal. Behavior is cooperative.        Thought Content: Thought content normal.        Judgment: Judgment normal.    Lesions that were biopsied and consistent with Mycobacterium infection 03/25/2021:      August 2nd 2022:       06/18/2021:        07/18/2021:      08/02/2021:  Right     Left leg     09/27/2021:  Right leg      Left leg     A/P  Mycobacteria chelonae soft tissue infection:  We have procured another 21 days of Nuzyra and will continue with clofazamine and endeavor to get through 6 months of therapy.  I would prefer if they would completely disappear no longer continue to be hyperpigmented.    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

## 2021-10-02 ENCOUNTER — Other Ambulatory Visit: Payer: Self-pay | Admitting: Gynecology

## 2021-10-02 DIAGNOSIS — Z1231 Encounter for screening mammogram for malignant neoplasm of breast: Secondary | ICD-10-CM

## 2021-10-10 ENCOUNTER — Other Ambulatory Visit (HOSPITAL_COMMUNITY): Payer: Self-pay

## 2021-10-15 ENCOUNTER — Telehealth: Payer: Self-pay

## 2021-10-15 ENCOUNTER — Other Ambulatory Visit (HOSPITAL_COMMUNITY): Payer: Self-pay

## 2021-10-15 NOTE — Telephone Encounter (Signed)
[facility] Patient Advocate Encounter  Prior Authorization for Elesa Hacker has been approved.    PA# [id number] Effective dates: 10/15/21 through 10/12/22  Patients co-pay is $3896.08.   [facility] will continue to follow.  Ileene Patrick, [facility] Patient [facility] Phone: [phone] Fax:  [phone]

## 2021-10-30 ENCOUNTER — Other Ambulatory Visit (HOSPITAL_COMMUNITY): Payer: Self-pay

## 2021-11-07 ENCOUNTER — Telehealth: Payer: Self-pay | Admitting: Pharmacist

## 2021-11-07 NOTE — Telephone Encounter (Signed)
2 bottles of clofazimine are available in the pharmacy office for patient when needed.  Javiana Anwar L. Dao Mearns, PharmD [facility] Clinical Pharmacist Practitioner

## 2021-11-13 ENCOUNTER — Other Ambulatory Visit: Payer: Self-pay | Admitting: Pharmacist

## 2021-11-13 DIAGNOSIS — R110 Nausea: Secondary | ICD-10-CM

## 2021-11-14 ENCOUNTER — Other Ambulatory Visit (HOSPITAL_COMMUNITY): Payer: Self-pay

## 2021-12-09 ENCOUNTER — Ambulatory Visit
Admission: RE | Admit: 2021-12-09 | Discharge: 2021-12-09 | Disposition: A | Discharge status: Home / Self Care | Payer: Medicare Other | Source: Ambulatory Visit | Attending: Gynecology | Admitting: Gynecology

## 2021-12-09 DIAGNOSIS — Z1231 Encounter for screening mammogram for malignant neoplasm of breast: Secondary | ICD-10-CM

## 2021-12-09 NOTE — Progress Notes (Signed)
Subjective:   Chief complaints: " I think I am having an allergic reaction to the [place]"   Patient ID: Allison Randall, female    DOB: [birth year], 71 y.o.   MRN: [id number]  HPI  Allison Randall is a 71 year-old lady who has a past medical history significant for rheumatoid arthritis, hyperlipidemia HSV infection, who had been getting regular treatment with saline injections to treat varicose veins.  The past these had gone without complication but however this past February she says that the typical erythematous areas in her legs that ensue after injection of the saline failed to resolve and several areas on her upper legs in particular became more erythematous and more inflamed.  She was given several rounds of antibiotics and seen by dermatology who ultimately performed a biopsy which came back with granulomatous pathology as well as evidence of HSV faction with inclusion bodies present and stains positive.  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 Delman Cheadle.  M chelonae I susceptibilities are shown before and pictured.   The only reasonable options I saw at that time for her would be oral azithromycin 500 mg along with clofazimine 2 tablets once daily.  She started clofazimine and azithromycin.  She seem to be doing pretty well.  Then in June 2022 she went to a very loud country music concert and afterwards noticed that she had loss of hearing and also tinnitus this persisted and continues to the present day and she is visibly hard of hearing in the clinic.  She had looked up possibility of azithromycin causing hearing loss and while rare it can indeed do so.  I had heard of this I had counseled her to stop the antibiotics but she wanted to continue at the time since there were not many antibiotic options.  UNFortunately as predicted off antibiotics her Mycobacterium had begun to cause her problems  again and lesions have grown and one has become purulent. She is for cataract surgery in October as well as come concerned about the fact that she still has soft tissue infection.  I assured her this should not affect her cataract surgery.  We were able to get her an infusion of IV Nuzyra, and now her Medicare drug plan will cover Elesa Hacker though it is still leaving her with $3300 plus co-pay for the first 30 days of tablets.  We were able to get 15 days of tablets from the drug company an additional 15 days which we have given her.  Another option were contemplating was therapy with clofazimine and bedalaquine but I worried about QT prolonging effects of both the clofazamine and bedalaquine.  She also has been on Zofran and had nausea.  We obtained a twelve-lead EKG at last visit which showed a QT that was prolonged to 422 ms and a QTC of 462 ms with normal sinus rhythm and some preventricular ventricular contractions  Turn to clinic for follow-up today and we obtained a repeat EKG which now shows a QT of 387 and a QTC of 426 ms with normal sinus rhythm.  She continued of improvement in the areas in her legs.  Her hearing loss completely returned to normal.    Since I last saw Allison Randall she has developed several areas of hyperpigmentation in her legs including the calves thighs arms and upper chest.  She also has dry skin and intense pruritus.  Upon examining them  today they seem consistent with a tetracycline induced hyperpigmentation rash.          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    Social History   Socioeconomic History   Marital status: Married    Spouse name: Not on file   Number of children: Not on file   Years of education: Not on file   Highest education level: Not on file  Occupational History   Not on file  Tobacco Use   Smoking status: Former    Types: Cigarettes    Quit date: 11/30/2007    Years since quitting: 14.0   Smokeless tobacco: Never  Substance and Sexual Activity   Alcohol use: No   Drug use: No   Sexual activity: Not on file  Other Topics Concern   Not on file  Social History Narrative   Not on file   Social Determinants of Health   Financial Resource Strain: Not on file  Food Insecurity: Not on file  Transportation Needs: Not on file  Physical Activity: Not on file  Stress: Not on file  Social Connections: Not on file    Allergies  Allergen Reactions   Azithromycin Tinitus    Hearing loss and tinnitus   No Known Allergies      Current Outpatient Medications:    AMBULATORY NON FORMULARY MEDICATION, Take 100 mg by mouth daily. Medication Name: Clofazimine, Disp: 100 capsule, Rfl: 2   atorvastatin (LIPITOR) 20 MG tablet, Take 20 mg by mouth daily at 6 PM., Disp: , Rfl:    Calcium-Vitamin D-Vitamin K 751-700-17 MG-UNT-MCG CHEW, Chew 2 tablets by mouth daily., Disp: , Rfl:    cholecalciferol (VITAMIN D) 1000 UNITS tablet, Take 1,000 Units by mouth daily., Disp: , Rfl:    cycloSPORINE (RESTASIS) 0.05 % ophthalmic emulsion, Place 1 drop into both eyes 2 (two) times daily., Disp: , Rfl:    fexofenadine-pseudoephedrine (ALLEGRA-D 24) 180-240 MG 24 hr tablet, Take 1 tablet by mouth daily., Disp: , Rfl:    folic acid (FOLVITE) 1 MG tablet, Take 1 mg by mouth 2 (two) times daily. , Disp: , Rfl:    Krill Oil Omega-3 300 MG CAPS, Take 1 capsule by mouth daily., Disp: , Rfl:    levothyroxine (SYNTHROID,  LEVOTHROID) 112 MCG tablet, Take 112 mcg by mouth daily before breakfast., Disp: , Rfl:    losartan-hydrochlorothiazide (HYZAAR) 100-12.5 MG per tablet, Take 1 tablet by mouth daily., Disp: , Rfl:    Lysine 1000 MG TABS, Take 1,000 mg by mouth 2 (two) times daily., Disp: , Rfl:    methocarbamol (ROBAXIN) 500 MG tablet, Take 1 tablet (500 mg total) by mouth every 6 (six) hours as needed for muscle spasms., Disp: 60 tablet, Rfl: 0   methotrexate (RHEUMATREX) 2.5 MG tablet, Take 25 mg by mouth once a week. Takes 10 tablets Caution:Chemotherapy. Protect from  light. Takes on Wednesday, Disp: , Rfl:    moxifloxacin (VIGAMOX) 0.5 % ophthalmic solution, Place 1 drop into the left eye 4 (four) times daily., Disp: , Rfl:    Multiple Vitamin (MULITIVITAMIN WITH MINERALS) TABS, Take 1 tablet by mouth daily., Disp: , Rfl:    naproxen sodium (ANAPROX) 220 MG tablet, Take 220 mg by mouth 2 (two) times daily as needed (pain)., Disp: , Rfl:    Omadacycline Tosylate 150 MG TABS, Take 300 mg by mouth daily., Disp: 28 tablet, Rfl: 5   ondansetron (ZOFRAN) 4 MG tablet, TAKE 1 TABLET BY MOUTH EVERY 8 HOURS AS NEEDED FOR NAUSEA OR VOMITING, Disp: 40 tablet, Rfl: 0   oxyCODONE-acetaminophen (PERCOCET/ROXICET) 5-325 MG tablet, Take 1-2 tablets by mouth every 4 (four) hours as needed for moderate pain., Disp: 70 tablet, Rfl: 0   prednisoLONE acetate (PRED FORTE) 1 % ophthalmic suspension, Place 1 drop into the left eye 4 (four) times daily., Disp: , Rfl:    Probiotic Product (PROBIOTIC PO), Take 1 tablet by mouth daily., Disp: , Rfl:    Pseudoephedrine HCl (SUDAFED 24 HOUR PO), Take 1 tablet by mouth daily as needed (sinus pressure)., Disp: , Rfl:    valACYclovir (VALTREX) 500 MG tablet, Take 2,000 mg by mouth 2 (two) times daily as needed (cold sores). , Disp: , Rfl:    zolpidem (AMBIEN) 10 MG tablet, Take 2.5 mg by mouth at bedtime as needed for sleep., Disp: , Rfl:    Review of Systems  Constitutional:  Negative for  activity change, appetite change, chills, diaphoresis, fatigue, fever and unexpected weight change.  HENT:  Negative for congestion, rhinorrhea, sinus pressure, sneezing, sore throat and trouble swallowing.   Eyes:  Negative for photophobia and visual disturbance.  Respiratory:  Negative for cough, chest tightness, shortness of breath, wheezing and stridor.   Cardiovascular:  Negative for chest pain, palpitations and leg swelling.  Gastrointestinal:  Negative for abdominal distention, abdominal pain, anal bleeding, blood in stool, constipation, diarrhea, nausea and vomiting.  Genitourinary:  Negative for difficulty urinating, dysuria, flank pain and hematuria.  Musculoskeletal:  Negative for arthralgias, back pain, gait problem, joint swelling and myalgias.  Skin:  Positive for color change and rash. Negative for pallor and wound.  Neurological:  Negative for dizziness, tremors, weakness and light-headedness.  Hematological:  Negative for adenopathy. Does not bruise/bleed easily.  Psychiatric/Behavioral:  Negative for agitation, behavioral problems, confusion, decreased concentration, dysphoric mood and sleep disturbance.       Objective:   Physical Exam Constitutional:      General: She is not in acute distress.    Appearance: Normal appearance. She is well-developed. She is not ill-appearing or diaphoretic.  HENT:     Head: Normocephalic and atraumatic.     Right Ear: Hearing and external ear normal.     Left Ear: Hearing and external ear normal.     Nose: No nasal deformity or rhinorrhea.  Eyes:     General: No scleral icterus.    Conjunctiva/sclera: Conjunctivae normal.     Right eye: Right conjunctiva is not injected.     Left eye: Left conjunctiva is not injected.     Pupils: Pupils are equal, round, and reactive to light.  Neck:     Vascular: No JVD.  Cardiovascular:     Rate and Rhythm: Normal rate and regular rhythm.     Heart sounds: S1 normal and S2 normal.  Abdominal:      General: Bowel sounds are normal.  There is no distension.     Palpations: Abdomen is soft.     Tenderness: There is no abdominal tenderness.  Musculoskeletal:        General: Normal range of motion.     Right shoulder: Normal.     Left shoulder: Normal.     Cervical back: Normal range of motion and neck supple.     Right hip: Normal.     Left hip: Normal.     Right knee: Normal.     Left knee: Normal.  Lymphadenopathy:     Head:     Right side of head: No submandibular, preauricular or posterior auricular adenopathy.     Left side of head: No submandibular, preauricular or posterior auricular adenopathy.     Cervical: No cervical adenopathy.     Right cervical: No superficial or deep cervical adenopathy.    Left cervical: No superficial or deep cervical adenopathy.  Skin:    General: Skin is warm.     Coloration: Skin is not pale.     Findings: Rash present. No abrasion, bruising, ecchymosis, erythema or lesion.     Nails: There is no clubbing.  Neurological:     Mental Status: She is alert and oriented to person, place, and time.     Sensory: No sensory deficit.     Coordination: Coordination normal.     Gait: Gait normal.  Psychiatric:        Attention and Perception: She is attentive.        Mood and Affect: Mood normal.        Speech: Speech normal.        Behavior: Behavior normal. Behavior is cooperative.        Thought Content: Thought content normal.        Judgment: Judgment normal.    Lesions that were biopsied and consistent with Mycobacterium infection 03/25/2021:      August 2nd 2022:       06/18/2021:        07/18/2021:      08/02/2021:  Right     Left leg     09/27/2021:  Right leg      Left leg    Right leg December 10, 2021:       Left leg 2028 2023:    Hyperpigmented areas throughout arms legs chest consistent with tetracycline induced hypopigmented rash December 10, 2021:         A/P  Rash  likely due to the [place].  This does not appear to be an allergic reaction but rather an adverse reaction of a hyperpigmentation rash which I have seen before with minocycline and doxycycline chronic use.  This should improve with coming off antibiotics that we do not want to do that yet  Mycobacterium chelonae soft tissue infection:  Going to endeavor to get her through 6 months of therapy which really only involves roughly 3 more weeks of treatment.  We will then stop treatment observe off treatment  If her infection recurs we will need to consider other options including surgical resection versus combination of parenteral and oral antibiotics versus even something such as phage therapy   Rheumatoid throat is: Holding methotrexate still  Sensorineural hearing loss: Restored  QT prolongation: Stable  Hypothyroidism she requested that we check her TSH and T4 for her upcoming endocrinology appointment  I spent 44 minutes with the patient including than 50% of the time in face to face counseling of the patient re nature  of NTM infections, tetracycline induced rash along with review of medical records in preparation for the visit and during the visit and in coordination of her care.

## 2021-12-10 ENCOUNTER — Other Ambulatory Visit: Payer: Self-pay

## 2021-12-10 ENCOUNTER — Ambulatory Visit (INDEPENDENT_AMBULATORY_CARE_PROVIDER_SITE_OTHER): Payer: Medicare Other | Admitting: Infectious Disease

## 2021-12-10 ENCOUNTER — Other Ambulatory Visit: Payer: Self-pay | Admitting: Gynecology

## 2021-12-10 VITALS — BP 161/87 | HR 92 | Temp 97.9°F | Wt 134.0 lb

## 2021-12-10 DIAGNOSIS — A318 Other mycobacterial infections: Secondary | ICD-10-CM

## 2021-12-10 DIAGNOSIS — L270 Generalized skin eruption due to drugs and medicaments taken internally: Secondary | ICD-10-CM | POA: Diagnosis present

## 2021-12-10 DIAGNOSIS — R9431 Abnormal electrocardiogram [ECG] [EKG]: Secondary | ICD-10-CM

## 2021-12-10 DIAGNOSIS — I10 Essential (primary) hypertension: Secondary | ICD-10-CM

## 2021-12-10 DIAGNOSIS — H9313 Tinnitus, bilateral: Secondary | ICD-10-CM | POA: Diagnosis not present

## 2021-12-10 DIAGNOSIS — H9193 Unspecified hearing loss, bilateral: Secondary | ICD-10-CM | POA: Diagnosis not present

## 2021-12-10 DIAGNOSIS — B009 Herpesviral infection, unspecified: Secondary | ICD-10-CM

## 2021-12-10 DIAGNOSIS — M057A Rheumatoid arthritis with rheumatoid factor of other specified site without organ or systems involvement: Secondary | ICD-10-CM

## 2021-12-10 DIAGNOSIS — E039 Hypothyroidism, unspecified: Secondary | ICD-10-CM | POA: Diagnosis not present

## 2021-12-10 DIAGNOSIS — R928 Other abnormal and inconclusive findings on diagnostic imaging of breast: Secondary | ICD-10-CM

## 2021-12-11 ENCOUNTER — Telehealth: Payer: Self-pay

## 2021-12-11 LAB — SEDIMENTATION RATE: Sed Rate: 2 mm/h (ref 0–30)

## 2021-12-11 LAB — CBC WITH DIFFERENTIAL/PLATELET
Absolute Monocytes: 686 cells/uL (ref 200–950)
Basophils Absolute: 26 cells/uL (ref 0–200)
Basophils Relative: 0.3 %
Eosinophils Absolute: 9 cells/uL — ABNORMAL LOW (ref 15–500)
Eosinophils Relative: 0.1 %
HCT: 44.7 % (ref 35.0–45.0)
Hemoglobin: 14.8 g/dL (ref 11.7–15.5)
Lymphs Abs: 1496 cells/uL (ref 850–3900)
MCH: 29.6 pg (ref 27.0–33.0)
MCHC: 33.1 g/dL (ref 32.0–36.0)
MCV: 89.4 fL (ref 80.0–100.0)
MPV: 9.2 fL (ref 7.5–12.5)
Monocytes Relative: 7.8 %
Neutro Abs: 6582 cells/uL (ref 1500–7800)
Neutrophils Relative %: 74.8 %
Platelets: 299 10*3/uL (ref 140–400)
RBC: 5 10*6/uL (ref 3.80–5.10)
RDW: 13.5 % (ref 11.0–15.0)
Total Lymphocyte: 17 %
WBC: 8.8 10*3/uL (ref 3.8–10.8)

## 2021-12-11 LAB — COMPLETE METABOLIC PANEL WITH GFR
AG Ratio: 1.6 (calc) (ref 1.0–2.5)
ALT: 21 U/L (ref 6–29)
AST: 17 U/L (ref 10–35)
Albumin: 4.5 g/dL (ref 3.6–5.1)
Alkaline phosphatase (APISO): 79 U/L (ref 37–153)
BUN/Creatinine Ratio: 25 (calc) — ABNORMAL HIGH (ref 6–22)
BUN: 26 mg/dL — ABNORMAL HIGH (ref 7–25)
CO2: 27 mmol/L (ref 20–32)
Calcium: 9.9 mg/dL (ref 8.6–10.4)
Chloride: 105 mmol/L (ref 98–110)
Creat: 1.06 mg/dL — ABNORMAL HIGH (ref 0.60–1.00)
Globulin: 2.9 g/dL (calc) (ref 1.9–3.7)
Glucose, Bld: 93 mg/dL (ref 65–99)
Potassium: 4.1 mmol/L (ref 3.5–5.3)
Sodium: 140 mmol/L (ref 135–146)
Total Bilirubin: 0.7 mg/dL (ref 0.2–1.2)
Total Protein: 7.4 g/dL (ref 6.1–8.1)
eGFR: 55 mL/min/{1.73_m2} — ABNORMAL LOW (ref >=60)

## 2021-12-11 LAB — T4, FREE: Free T4: 1.2 ng/dL (ref 0.8–1.8)

## 2021-12-11 LAB — TSH+FREE T4: TSH W/REFLEX TO FT4: 10.18 mIU/L — ABNORMAL HIGH (ref 0.40–4.50)

## 2021-12-11 LAB — C-REACTIVE PROTEIN: CRP: 1.9 mg/L (ref <8.0)

## 2021-12-11 NOTE — Telephone Encounter (Signed)
-----   Message from Truman Hayward, MD sent at 12/11/2021  1:48 PM EST ----- ?Her thyroid labs are back for her endocrinologist. I am not sure who- that is ?----- Message ----- ?From: Interface, [facility] Results In ?Sent: 12/10/2021  11:12 PM EST ?To: Truman Hayward, MD ? ? ?

## 2021-12-24 ENCOUNTER — Ambulatory Visit
Admission: RE | Admit: 2021-12-24 | Discharge: 2021-12-24 | Disposition: A | Discharge status: Home / Self Care | Payer: Medicare Other | Source: Ambulatory Visit | Attending: Gynecology | Admitting: Gynecology

## 2021-12-24 ENCOUNTER — Other Ambulatory Visit: Payer: Self-pay | Admitting: Gynecology

## 2021-12-24 DIAGNOSIS — R928 Other abnormal and inconclusive findings on diagnostic imaging of breast: Secondary | ICD-10-CM

## 2021-12-24 DIAGNOSIS — N6320 Unspecified lump in the left breast, unspecified quadrant: Secondary | ICD-10-CM

## 2022-01-02 ENCOUNTER — Other Ambulatory Visit: Payer: Self-pay | Admitting: Gynecology

## 2022-01-02 ENCOUNTER — Ambulatory Visit
Admission: RE | Admit: 2022-01-02 | Discharge: 2022-01-02 | Disposition: A | Discharge status: Home / Self Care | Payer: Medicare Other | Source: Ambulatory Visit | Attending: Gynecology | Admitting: Gynecology

## 2022-01-02 DIAGNOSIS — N6320 Unspecified lump in the left breast, unspecified quadrant: Secondary | ICD-10-CM

## 2022-01-07 ENCOUNTER — Ambulatory Visit
Admission: RE | Admit: 2022-01-07 | Discharge: 2022-01-07 | Disposition: A | Discharge status: Home / Self Care | Payer: Medicare Other | Source: Ambulatory Visit | Attending: Gynecology | Admitting: Gynecology

## 2022-01-07 ENCOUNTER — Other Ambulatory Visit: Payer: Self-pay | Admitting: Gynecology

## 2022-01-07 DIAGNOSIS — N6320 Unspecified lump in the left breast, unspecified quadrant: Secondary | ICD-10-CM

## 2022-01-08 ENCOUNTER — Telehealth: Payer: Self-pay

## 2022-01-08 NOTE — Telephone Encounter (Signed)
Patient is calling asking if she can restart her methotrexate since she has now completed her antibiotics with our office. Patient is stating Rheumatology is needing documentation that she can restart the methotrexate.  ?Allison Randall Tilda Burrow, CMA  ?

## 2022-01-09 NOTE — Telephone Encounter (Signed)
I spoke to [place] with [facility] and left your contact information for the patient's Rheumatologist to call you back. ?Berline Semrad T Kiyra Slaubaugh ? ?

## 2022-01-10 NOTE — Telephone Encounter (Signed)
Patient advised that Dr. Tommy Medal spoke to her Rheumatologist and advised them patient could restart the methotrexate.  ?

## 2022-02-13 ENCOUNTER — Encounter: Payer: Self-pay | Admitting: Infectious Disease

## 2022-02-13 ENCOUNTER — Ambulatory Visit (INDEPENDENT_AMBULATORY_CARE_PROVIDER_SITE_OTHER): Payer: Medicare Other | Admitting: Infectious Disease

## 2022-02-13 ENCOUNTER — Other Ambulatory Visit: Payer: Self-pay

## 2022-02-13 VITALS — BP 121/68 | HR 98 | Resp 16 | Ht 64.0 in | Wt 134.0 lb

## 2022-02-13 DIAGNOSIS — M057A Rheumatoid arthritis with rheumatoid factor of other specified site without organ or systems involvement: Secondary | ICD-10-CM | POA: Diagnosis present

## 2022-02-13 DIAGNOSIS — J189 Pneumonia, unspecified organism: Secondary | ICD-10-CM

## 2022-02-13 DIAGNOSIS — C44722 Squamous cell carcinoma of skin of right lower limb, including hip: Secondary | ICD-10-CM

## 2022-02-13 DIAGNOSIS — H9193 Unspecified hearing loss, bilateral: Secondary | ICD-10-CM | POA: Diagnosis not present

## 2022-02-13 DIAGNOSIS — C44721 Squamous cell carcinoma of skin of unspecified lower limb, including hip: Secondary | ICD-10-CM | POA: Insufficient documentation

## 2022-02-13 HISTORY — DX: Pneumonia, unspecified organism: J18.9

## 2022-02-13 HISTORY — DX: Squamous cell carcinoma of skin of unspecified lower limb, including hip: C44.721

## 2022-02-13 NOTE — Progress Notes (Signed)
? ?Subjective:  ? ?Chief complaint: She has had cough and been diagnosed with pneumonia and she still has persistence of her tetracycline induced rash ? Patient ID: Allison Randall, female    DOB: December 01, 1950, 71 y.o.   MRN: [id number] ? ?HPI ? ?Allison Randall is a 71 year-old lady who has a past medical history significant for rheumatoid arthritis, hyperlipidemia HSV infection, who had been getting regular treatment with saline injections to treat varicose veins.  The past these had gone without complication but however this past February she says that the typical erythematous areas in her legs that ensue after injection of the saline failed to resolve and several areas on her upper legs in particular became more erythematous and more inflamed.  She was given several rounds of antibiotics and seen by dermatology who ultimately performed a biopsy which came back with granulomatous pathology as well as evidence of HSV faction with inclusion bodies present and stains positive.  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 Delman Cheadle. ? ?M chelonae I susceptibilities are shown before and pictured. ? ? ?The only reasonable options I saw at that time for her would be oral azithromycin 500 mg along with clofazimine 2 tablets once daily. ? ?She started clofazimine and azithromycin. ? ?She seem to be doing pretty well. ? ?Then in June 2022 she went to a very loud country music concert and afterwards noticed that she had loss of hearing and also tinnitus this persisted and continues to the present day and she is visibly hard of hearing in the clinic.  She had looked up possibility of azithromycin causing hearing loss and while rare it can indeed do so.  I had heard of this I had counseled her to stop the antibiotics but she wanted to continue at the time since there were not many antibiotic options. ? ?UNFortunately as predicted off antibiotics  her Mycobacterium had begun to cause her problems again and lesions have grown and one has become purulent. She is for cataract surgery in October as well as come concerned about the fact that she still has soft tissue infection.  I assured her this should not affect her cataract surgery. ? ?We were able to get her an infusion of IV Nuzyra, and now her Medicare drug plan will cover Elesa Hacker though it is still leaving her with $3300 plus co-pay for the first 30 days of tablets. ? ?We were able to get 15 days of tablets from the drug company an additional 15 days which we have given her. ? ?Another option were contemplating was therapy with clofazimine and bedalaquine but I worried about QT prolonging effects of both the clofazamine and bedalaquine. ? ?She also has been on Zofran and had nausea. ? ?We obtained a twelve-lead EKG at last visit which showed a QT that was prolonged to 422 ms and a QTC of 462 ms with normal sinus rhythm and some preventricular ventricular contractions ? ?Turn to clinic for follow-up today and we obtained a repeat EKG which now shows a QT of 387 and a QTC of 426 ms with normal sinus rhythm. ? ?She continued of improvement in the areas in her legs. ? ?Her hearing loss completely returned to normal. ? ? ? ?Since I last saw Allison Randall she has developed several areas of hyperpigmentation in her legs including the calves thighs arms and upper chest. ? ?She also has dry skin and  intense pruritus. ? ?Upon examining them today they seem consistent with a tetracycline induced hyperpigmentation rash. ? ?At last visit we stopped her antimycobacterial therapy and have been observing her off therapy. ? ?Her areas of hyperpigmentation due to tetracycline have not resolved. ? ?Fortunately her areas of more confluent hyperpigmentation where she had mycobacterial infection have not grown or become purulent while she has been off antibiotics. ? ?She was diagnosed with squamous cell carcinoma of the skin there is  going to be treated by injectable 5-FU. ? ? ? ? ? ? ? ?Past Medical History:  ?Diagnosis Date  ? Arthritis   ? Cataract 06/18/2021  ? Hearing loss 05/14/2021  ? High triglycerides   ? HSV infection 03/25/2021  ? Hypertension   ? Hypothyroidism   ? 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 Nausea 05/14/2021  ? 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 Rheumatoid arthritis ([place])   ? Rheumatoid arthritis ([place]) 03/25/2021  ? Tinnitus 05/14/2021  ? ? ?Past Surgical History:  ?Procedure Laterality Date  ? BREAST BIOPSY Right 10/22/2010  ? CHOLECYSTECTOMY    ? eyelid lift    ? teeth implants    ? bottom, with screws  ? ? ?No family history on file. ? ?  ?Social History  ? ?Socioeconomic History  ? Marital status: Married  ?  Spouse name: Not on file  ? Number of children: Not on file  ? Years of education: Not on file  ? Highest education level: Not on file  ?Occupational History  ? Not on file  ?Tobacco Use  ? Smoking status: Former  ?  Types: Cigarettes  ?  Quit date: 11/30/2007  ?  Years since quitting: 14.2  ? Smokeless tobacco: Never  ?Substance and Sexual Activity  ? Alcohol use: No  ? Drug use: No  ? Sexual activity: Not on file  ?Other Topics Concern  ? Not on file  ?Social History Narrative  ? Not on file  ? ?Social Determinants of Health  ? ?Financial Resource Strain: Not on file  ?Food Insecurity: Not on file  ?Transportation Needs: Not on file  ?Physical Activity: Not on file  ?Stress: Not on file  ?Social Connections: Not on file  ? ? ?Allergies  ?Allergen Reactions  ? Azithromycin Tinitus  ?  Hearing loss and tinnitus  ? No Known Allergies   ? ? ? ?Current Outpatient Medications:  ?  AMBULATORY NON FORMULARY MEDICATION, Take 100 mg by mouth daily. Medication Name: Clofazimine, Disp: 100 capsule, Rfl: 2 ?  amoxicillin-clavulanate (AUGMENTIN) 875-125 MG tablet, Take 1 tablet by mouth 2 (two) times daily., Disp: , Rfl:  ?  atorvastatin (LIPITOR) 20 MG tablet, Take 20 mg by mouth daily at 6 PM., Disp: , Rfl:  ?  Calcium-Vitamin  D-Vitamin K 500-100-40 MG-UNT-MCG CHEW, Chew 2 tablets by mouth daily., Disp: , Rfl:  ?  cholecalciferol (VITAMIN D) 1000 UNITS tablet, Take 1,000 Units by mouth daily., Disp: , Rfl:  ?  cycloSPORINE (RESTASIS) 0.05 % ophthalmic emulsion, Place 1 drop into both eyes 2 (two) times daily., Disp: , Rfl:  ?  doxycycline (VIBRAMYCIN) 100 MG capsule, Take 100 mg by mouth 2 (two) times daily., Disp: , Rfl:  ?  fexofenadine-pseudoephedrine (ALLEGRA-D 24) 180-240 MG 24 hr tablet, Take 1 tablet by mouth daily., Disp: , Rfl:  ?  folic acid (FOLVITE) 1 MG tablet, Take 1 mg by mouth 2 (two) times daily. , Disp: , Rfl:  ?  Krill Oil Omega-3 300 MG CAPS, Take  1 capsule by mouth daily., Disp: , Rfl:  ?  levothyroxine (SYNTHROID, LEVOTHROID) 112 MCG tablet, Take 112 mcg by mouth daily before breakfast., Disp: , Rfl:  ?  losartan-hydrochlorothiazide (HYZAAR) 100-12.5 MG per tablet, Take 1 tablet by mouth daily., Disp: , Rfl:  ?  Lysine 1000 MG TABS, Take 1,000 mg by mouth 2 (two) times daily., Disp: , Rfl:  ?  methocarbamol (ROBAXIN) 500 MG tablet, Take 1 tablet (500 mg total) by mouth every 6 (six) hours as needed for muscle spasms., Disp: 60 tablet, Rfl: 0 ?  methotrexate (RHEUMATREX) 2.5 MG tablet, Take 25 mg by mouth once a week. Takes 10 tablets Caution:Chemotherapy. Protect from light. Takes on Wednesday, Disp: , Rfl:  ?  moxifloxacin (VIGAMOX) 0.5 % ophthalmic solution, Place 1 drop into the left eye 4 (four) times daily., Disp: , Rfl:  ?  Multiple Vitamin (MULITIVITAMIN WITH MINERALS) TABS, Take 1 tablet by mouth daily., Disp: , Rfl:  ?  naproxen sodium (ANAPROX) 220 MG tablet, Take 220 mg by mouth 2 (two) times daily as needed (pain)., Disp: , Rfl:  ?  Omadacycline Tosylate 150 MG TABS, Take 300 mg by mouth daily., Disp: 28 tablet, Rfl: 5 ?  ondansetron (ZOFRAN) 4 MG tablet, TAKE 1 TABLET BY MOUTH EVERY 8 HOURS AS NEEDED FOR NAUSEA OR VOMITING, Disp: 40 tablet, Rfl: 0 ?  oxyCODONE-acetaminophen (PERCOCET/ROXICET) 5-325 MG  tablet, Take 1-2 tablets by mouth every 4 (four) hours as needed for moderate pain., Disp: 70 tablet, Rfl: 0 ?  prednisoLONE acetate (PRED FORTE) 1 % ophthalmic suspension, Place 1 drop into the left eye 4

## 2022-02-24 ENCOUNTER — Ambulatory Visit: Payer: Medicare Other | Admitting: Infectious Disease

## 2022-02-25 ENCOUNTER — Ambulatory Visit: Payer: Medicare Other | Admitting: Infectious Disease

## 2022-07-11 ENCOUNTER — Ambulatory Visit
Admission: RE | Admit: 2022-07-11 | Discharge: 2022-07-11 | Disposition: A | Discharge status: Home / Self Care | Payer: Medicare Other | Source: Ambulatory Visit | Attending: Gynecology | Admitting: Gynecology

## 2022-07-11 ENCOUNTER — Other Ambulatory Visit: Payer: Self-pay | Admitting: Gynecology

## 2022-07-11 DIAGNOSIS — N6320 Unspecified lump in the left breast, unspecified quadrant: Secondary | ICD-10-CM

## 2022-10-28 ENCOUNTER — Other Ambulatory Visit: Payer: Self-pay | Admitting: Gynecology

## 2022-10-28 DIAGNOSIS — Z1231 Encounter for screening mammogram for malignant neoplasm of breast: Secondary | ICD-10-CM

## 2022-12-18 ENCOUNTER — Ambulatory Visit
Admission: RE | Admit: 2022-12-18 | Discharge: 2022-12-18 | Disposition: A | Discharge status: Home / Self Care | Payer: Medicare Other | Source: Ambulatory Visit | Attending: Gynecology | Admitting: Gynecology

## 2022-12-18 DIAGNOSIS — Z1231 Encounter for screening mammogram for malignant neoplasm of breast: Secondary | ICD-10-CM

## 2023-03-03 ENCOUNTER — Ambulatory Visit (HOSPITAL_BASED_OUTPATIENT_CLINIC_OR_DEPARTMENT_OTHER)
Admission: RE | Admit: 2023-03-03 | Discharge: 2023-03-03 | Disposition: A | Discharge status: Home / Self Care | Payer: Medicare Other | Source: Ambulatory Visit | Attending: Physician Assistant | Admitting: Physician Assistant

## 2023-03-03 ENCOUNTER — Other Ambulatory Visit (HOSPITAL_BASED_OUTPATIENT_CLINIC_OR_DEPARTMENT_OTHER): Payer: Self-pay | Admitting: Physician Assistant

## 2023-03-03 DIAGNOSIS — A319 Mycobacterial infection, unspecified: Secondary | ICD-10-CM

## 2023-03-05 ENCOUNTER — Other Ambulatory Visit (HOSPITAL_COMMUNITY): Payer: Self-pay

## 2023-03-05 ENCOUNTER — Ambulatory Visit (INDEPENDENT_AMBULATORY_CARE_PROVIDER_SITE_OTHER): Payer: Medicare Other | Admitting: Infectious Disease

## 2023-03-05 ENCOUNTER — Encounter: Payer: Self-pay | Admitting: Infectious Disease

## 2023-03-05 ENCOUNTER — Other Ambulatory Visit: Payer: Self-pay

## 2023-03-05 VITALS — BP 145/74 | HR 82 | Temp 97.9°F | Resp 16 | Ht 64.0 in | Wt 141.0 lb

## 2023-03-05 DIAGNOSIS — A318 Other mycobacterial infections: Secondary | ICD-10-CM

## 2023-03-05 DIAGNOSIS — R9431 Abnormal electrocardiogram [ECG] [EKG]: Secondary | ICD-10-CM

## 2023-03-05 DIAGNOSIS — R42 Dizziness and giddiness: Secondary | ICD-10-CM

## 2023-03-05 DIAGNOSIS — L270 Generalized skin eruption due to drugs and medicaments taken internally: Secondary | ICD-10-CM | POA: Diagnosis not present

## 2023-03-05 DIAGNOSIS — H9193 Unspecified hearing loss, bilateral: Secondary | ICD-10-CM

## 2023-03-05 DIAGNOSIS — H903 Sensorineural hearing loss, bilateral: Secondary | ICD-10-CM

## 2023-03-05 NOTE — Progress Notes (Signed)
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   MRN: [id number]  HPI  Allison Randall is a 72 year-old lady who has a past medical history significant for rheumatoid arthritis, hyperlipidemia HSV infection, who had been getting regular treatment with saline injections to treat varicose veins.  The past these had gone without complication but however this past February she says that the typical erythematous areas in her legs that ensue after injection of the saline failed to resolve and several areas on her upper legs in particular became more erythematous and more inflamed.  She was given several rounds of antibiotics and seen by dermatology who ultimately performed a biopsy which came back with granulomatous pathology as well as evidence of HSV faction with inclusion bodies present and stains positive.  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 Emily Filbert.  M chelonae I susceptibilities are shown before and pictured.   The only reasonable options I saw at that time for her would be oral azithromycin 500 mg along with clofazimine 2 tablets once daily.  She started clofazimine and azithromycin.  She seem to be doing pretty well.  Then in June 2022 she went to a very loud country music concert and afterwards noticed that she had loss of hearing and also tinnitus this persisted and continues to the present day and she is visibly hard of hearing in the clinic.  She had looked up possibility of azithromycin causing hearing loss and while rare it can indeed do so.  I had heard of this I had counseled her to stop the antibiotics but she wanted to continue at the time since there were not many antibiotic options.  UNFortunately as predicted off antibiotics her Mycobacterium had begun to cause her problems again and lesions  have grown and one has become purulent. She is for cataract surgery in October as well as come concerned about the fact that she still has soft tissue infection.  I assured her this should not affect her cataract surgery.  We were able to get her an infusion of IV Nuzyra, and now her Medicare drug plan will cover [name] though it is still leaving her with $3300 plus co-pay for the first 30 days of tablets.  We were able to get 15 days of tablets from the drug company an additional 15 days which we have given her.  Another option were contemplating was therapy with clofazimine and bedalaquine but I worried about QT prolonging effects of both the clofazamine and bedalaquine.  She also has been on Zofran and had nausea.  We obtained a twelve-lead EKG at last visit which showed a QT that was prolonged to 422 ms and a QTC of 462 ms with normal sinus rhythm and some preventricular ventricular contractions  Retruned  to clinic for follow-up ==and we obtained a repeat EKG which now shows a QT of 387 and a QTC of 426 ms with normal sinus rhythm.  She continued of improvement in the areas in her legs.  Her hearing loss completely returned to normal.  She then  developed several areas of hyperpigmentation in her legs including the calves thighs arms and upper chest.  She also has dry skin and intense pruritus.  Upon examining them today at prior visit they seem consistent with a tetracycline induced  hyperpigmentation rash.  In March  wwe stopped her antimycobacterial therapy and have been observing her off therapy.   Fortunately at time her areas of more confluent hyperpigmentation where she had mycobacterial infection have not grown or become purulent while she has been off antibiotics.  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  She continues to be followed with dermatology.  She then developed new lesions on her left lower leg, left upper thigh  with largest and most tender lesion and lesion in that thigh but also a lesion in her right arm.  Biopsies were performed and pathology from the left lower extremity revealed some mycobacteria within a granulomatous pathology.  The cultures apparently that were taken did not arrive at proper temperature for the organism to be cultured and so no AFB culture sensitivity was obtained there.  The patient tells me that her right upper arm lesion was biopsied roughly a week ago and also sent for cultures though I do not have access to them yet.  She has stopped her MTX.       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    Social History   Socioeconomic History   Marital status: Married    Spouse name: Not on file   Number of children: Not on file   Years of education: Not on file   Highest education level: Not on file  Occupational History   Not on file  Tobacco Use   Smoking status: Former    Types: Cigarettes    Quit date: 11/30/2007    Years since quitting: 15.2   Smokeless tobacco: Never  Substance and Sexual Activity   Alcohol use: No   Drug use: No   Sexual activity: Not on file  Other Topics Concern   Not on file  Social History Narrative   Not on file   Social Determinants of Health   Financial Resource Strain: Not on file  Food Insecurity: Not on file   Transportation Needs: Not on file  Physical Activity: Not on file  Stress: Not on file  Social Connections: Not on file    Allergies  Allergen Reactions   Azithromycin Tinitus    Hearing loss and tinnitus   No Known Allergies      Current Outpatient Medications:    atorvastatin (LIPITOR) 20 MG tablet, Take 20 mg by mouth daily at 6 PM., Disp: , Rfl:    Calcium-Vitamin D-Vitamin K 500-100-40 MG-UNT-MCG CHEW, Chew 2 tablets by mouth daily., Disp: , Rfl:    cholecalciferol (VITAMIN D) 1000 UNITS tablet, Take 1,000 Units by mouth daily., Disp: , Rfl:    cycloSPORINE (RESTASIS) 0.05 % ophthalmic emulsion, Place 1 drop into both eyes 2 (two) times daily., Disp: , Rfl:  fexofenadine-pseudoephedrine (ALLEGRA-D 24) 180-240 MG 24 hr tablet, Take 1 tablet by mouth daily., Disp: , Rfl:    folic acid (FOLVITE) 1 MG tablet, Take 1 mg by mouth 2 (two) times daily. , Disp: , Rfl:    Krill Oil Omega-3 300 MG CAPS, Take 1 capsule by mouth daily., Disp: , Rfl:    levothyroxine (SYNTHROID, LEVOTHROID) 112 MCG tablet, Take 112 mcg by mouth daily before breakfast., Disp: , Rfl:    losartan-hydrochlorothiazide (HYZAAR) 100-12.5 MG per tablet, Take 1 tablet by mouth daily., Disp: , Rfl:    Lysine 1000 MG TABS, Take 1,000 mg by mouth 2 (two) times daily., Disp: , Rfl:    methocarbamol (ROBAXIN) 500 MG tablet, Take 1 tablet (500 mg total) by mouth every 6 (six) hours as needed for muscle spasms., Disp: 60 tablet, Rfl: 0   Multiple Vitamin (MULITIVITAMIN WITH MINERALS) TABS, Take 1 tablet by mouth daily., Disp: , Rfl:    Probiotic Product (PROBIOTIC PO), Take 1 tablet by mouth daily., Disp: , Rfl:    Pseudoephedrine HCl (SUDAFED 24 HOUR PO), Take 1 tablet by mouth daily as needed (sinus pressure)., Disp: , Rfl:    valACYclovir (VALTREX) 500 MG tablet, Take 2,000 mg by mouth 2 (two) times daily as needed (cold sores). , Disp: , Rfl:    zolpidem (AMBIEN) 10 MG tablet, Take 2.5 mg by mouth at bedtime as needed  for sleep., Disp: , Rfl:    AMBULATORY NON FORMULARY MEDICATION, Take 100 mg by mouth daily. Medication Name: Clofazimine, Disp: 100 capsule, Rfl: 2   methotrexate (RHEUMATREX) 2.5 MG tablet, Take 25 mg by mouth once a week. Takes 10 tablets Caution:Chemotherapy. Protect from light. Takes on Wednesday (Patient not taking: Reported on 03/05/2023), Disp: , Rfl:    Review of Systems  Constitutional:  Negative for activity change, appetite change, chills, diaphoresis, fatigue, fever and unexpected weight change.  HENT:  Negative for congestion, rhinorrhea, sinus pressure, sneezing, sore throat and trouble swallowing.   Eyes:  Negative for photophobia and visual disturbance.  Respiratory:  Negative for cough, chest tightness, shortness of breath, wheezing and stridor.   Cardiovascular:  Negative for chest pain, palpitations and leg swelling.  Gastrointestinal:  Negative for abdominal distention, abdominal pain, anal bleeding, blood in stool, constipation, diarrhea, nausea and vomiting.  Genitourinary:  Negative for difficulty urinating, dysuria, flank pain and hematuria.  Musculoskeletal:  Negative for arthralgias, back pain, gait problem, joint swelling and myalgias.  Skin:  Positive for rash. Negative for color change, pallor and wound.  Neurological:  Negative for dizziness, tremors, weakness, light-headedness and headaches.  Hematological:  Negative for adenopathy. Does not bruise/bleed easily.  Psychiatric/Behavioral:  Negative for agitation, behavioral problems, confusion, decreased concentration, dysphoric mood, sleep disturbance and suicidal ideas.        Objective:   Physical Exam Constitutional:      General: She is not in acute distress.    Appearance: She is not diaphoretic.  HENT:     Head: Normocephalic and atraumatic.     Right Ear: External ear normal.     Left Ear: External ear normal.     Nose: Nose normal.     Mouth/Throat:     Pharynx: No oropharyngeal exudate.  Eyes:      General: No scleral icterus.       Right eye: No discharge.        Left eye: No discharge.     Extraocular Movements: Extraocular movements intact.     Conjunctiva/sclera:  Conjunctivae normal.  Cardiovascular:     Rate and Rhythm: Normal rate and regular rhythm.  Pulmonary:     Effort: Pulmonary effort is normal. No respiratory distress.     Breath sounds: No wheezing.  Abdominal:     General: There is no distension.     Palpations: Abdomen is soft.     Tenderness: There is no rebound.  Musculoskeletal:        General: No tenderness. Normal range of motion.     Cervical back: Normal range of motion and neck supple.  Lymphadenopathy:     Cervical: No cervical adenopathy.  Skin:    General: Skin is warm and dry.     Coloration: Skin is not jaundiced or pale.     Findings: No erythema, lesion or rash.  Neurological:     General: No focal deficit present.     Mental Status: She is alert and oriented to person, place, and time.     Coordination: Coordination normal.  Psychiatric:        Mood and Affect: Mood normal.        Behavior: Behavior normal.        Thought Content: Thought content normal.        Judgment: Judgment normal.     Lesions that were biopsied and consistent with Mycobacterium infection 03/25/2021:      August 2nd 2022:       06/18/2021:        07/18/2021:      08/02/2021:  Right     Left leg     09/27/2021:  Right leg      Left leg    Right leg December 10, 2021:       Left leg 2028 2023:    Right leg 02/13/2022:       Left leg 02/13/2022:      Hyperpigmented areas throughout arms legs chest consistent with tetracycline induced hypopigmented rash December 10, 2021:        02/13/2022:     03/05/23:  Right leg chronic lesions not larger     5/23/204:  Left leg    Left thigh    Right arm      Recurrent M chelonae infection now with apparent new lesions in left thigh and  potentially right arm:  Very complicated clinical situation.  This recurrence of infection is certainly something we expected though we did not expect she would have new lesions more proximally.  The thigh 1 can make some sense in terms of spread from left lower extremity this area but it may also been that these areas were inoculated with that event that was something other than the saline infusion she did endorse gardening quite a bit and certainly could have had exposed soil to multiple areas on her skin.  That however is not our biggest issue the largest issue is the multidrug-resistant nature of the organism and her intolerances of different antibiotics were given including azithromycin that may have caught here because hearing loss along with problems with [name] causing a rash.  The bigger problem is higher though would be cost we apparently got her through most of her therapy with samples.  Think we need a new culture with sensitivity data.  I would like to run the case by Barbara Cower stat at [facility].  It may be prudent to have her seen at [facility] and potentially have new biopsy sent there and also cultures taken in particular.  There are  some providers at [facility] and at [facility] that are doing phage therapy and perhaps her organism could be tested for bacterial fate susceptibility.  I will email Dr. Valentina Lucks about her case.  In the interim I have scheduled to come back and see me at the end of June.  If it seems as if it is not necessary to send her to [facility] for biopsies and consideration of other novel therapies such as phage therapy we can endeavor to have her have a biopsy here locally.  The other thought I had with regards to the prohibitive cost of some these medications would be that if she can be seen at [facility] the meds would be covered by [facility].  RA: off of MTX  Tetracycline induced rash this has been improving slightly and may have to be something she has to put up with if we are  forced to use tetracycline again which seems likely unless she can be challenged with a macrolide or phage therapy or something else is an option such as potentially bedalaquine?  I have personally spent 60 minutes involved in face-to-face and non-face-to-face activities for this patient on the day of the visit. Professional time spent includes the following activities: Preparing to see the patient (review of tests), Obtaining and/or reviewing separately obtained history (admission/discharge record), Performing a medically appropriate examination and/or evaluation , Ordering medications/tests/procedures, referring and communicating with other health care professionals, Documenting clinical information in the EMR, Independently interpreting results (not separately reported), Communicating results to the patient/family/caregiver, Counseling and educating the patient/family/caregiver and Care coordination (not separately reported).

## 2023-03-06 ENCOUNTER — Telehealth: Payer: Self-pay | Admitting: Infectious Disease

## 2023-03-06 DIAGNOSIS — A318 Other mycobacterial infections: Secondary | ICD-10-CM

## 2023-03-06 LAB — CBC WITH DIFFERENTIAL/PLATELET
Absolute Monocytes: 580 cells/uL (ref 200–950)
Basophils Absolute: 28 cells/uL (ref 0–200)
Basophils Relative: 0.4 %
Eosinophils Absolute: 90 cells/uL (ref 15–500)
Eosinophils Relative: 1.3 %
HCT: 40.7 % (ref 35.0–45.0)
Hemoglobin: 13.5 g/dL (ref 11.7–15.5)
Lymphs Abs: 1435 cells/uL (ref 850–3900)
MCH: 30.3 pg (ref 27.0–33.0)
MCHC: 33.2 g/dL (ref 32.0–36.0)
MCV: 91.3 fL (ref 80.0–100.0)
MPV: 9.3 fL (ref 7.5–12.5)
Monocytes Relative: 8.4 %
Neutro Abs: 4768 cells/uL (ref 1500–7800)
Neutrophils Relative %: 69.1 %
Platelets: 209 10*3/uL (ref 140–400)
RBC: 4.46 10*6/uL (ref 3.80–5.10)
RDW: 13.5 % (ref 11.0–15.0)
Total Lymphocyte: 20.8 %
WBC: 6.9 10*3/uL (ref 3.8–10.8)

## 2023-03-06 LAB — COMPLETE METABOLIC PANEL WITH GFR
AG Ratio: 1.7 (calc) (ref 1.0–2.5)
ALT: 17 U/L (ref 6–29)
AST: 21 U/L (ref 10–35)
Albumin: 3.9 g/dL (ref 3.6–5.1)
Alkaline phosphatase (APISO): 80 U/L (ref 37–153)
BUN: 16 mg/dL (ref 7–25)
CO2: 29 mmol/L (ref 20–32)
Calcium: 9 mg/dL (ref 8.6–10.4)
Chloride: 102 mmol/L (ref 98–110)
Creat: 0.92 mg/dL (ref 0.60–1.00)
Globulin: 2.3 g/dL (calc) (ref 1.9–3.7)
Glucose, Bld: 99 mg/dL (ref 65–99)
Potassium: 3.2 mmol/L — ABNORMAL LOW (ref 3.5–5.3)
Sodium: 141 mmol/L (ref 135–146)
Total Bilirubin: 0.7 mg/dL (ref 0.2–1.2)
Total Protein: 6.2 g/dL (ref 6.1–8.1)
eGFR: 65 mL/min/{1.73_m2} (ref >=60)

## 2023-03-06 LAB — SEDIMENTATION RATE: Sed Rate: 2 mm/h (ref 0–30)

## 2023-03-06 LAB — C-REACTIVE PROTEIN: CRP: 4.6 mg/L (ref <8.0)

## 2023-03-06 NOTE — Telephone Encounter (Addendum)
corresponded with Allison Randall staff about the he evaluate her in person.  I put a referral to [facility] at [facility].

## 2023-03-30 ENCOUNTER — Telehealth: Payer: Self-pay | Admitting: Infectious Disease

## 2023-03-30 NOTE — Telephone Encounter (Signed)
Culture from Dermatology office + for M chelonae  She is going to be seeing [name] at [facility]

## 2023-04-08 ENCOUNTER — Encounter: Payer: Self-pay | Admitting: Infectious Disease

## 2023-04-08 ENCOUNTER — Ambulatory Visit (INDEPENDENT_AMBULATORY_CARE_PROVIDER_SITE_OTHER): Payer: Medicare Other | Admitting: Infectious Disease

## 2023-04-08 ENCOUNTER — Telehealth: Payer: Self-pay

## 2023-04-08 ENCOUNTER — Other Ambulatory Visit: Payer: Self-pay

## 2023-04-08 VITALS — BP 147/82 | HR 75 | Temp 97.5°F | Ht 64.0 in | Wt 137.0 lb

## 2023-04-08 DIAGNOSIS — L270 Generalized skin eruption due to drugs and medicaments taken internally: Secondary | ICD-10-CM | POA: Diagnosis not present

## 2023-04-08 DIAGNOSIS — M057A Rheumatoid arthritis with rheumatoid factor of other specified site without organ or systems involvement: Secondary | ICD-10-CM | POA: Diagnosis not present

## 2023-04-08 DIAGNOSIS — A318 Other mycobacterial infections: Secondary | ICD-10-CM

## 2023-04-08 DIAGNOSIS — H903 Sensorineural hearing loss, bilateral: Secondary | ICD-10-CM

## 2023-04-08 NOTE — Telephone Encounter (Signed)
Called Rosey Bath, [facility] liaison, to request that sensitivities be added on to mycobacterial culture that was obtained by dermatology. Rosey Bath confirms that sensitivities are currently in process, but just have not resulted yet.   Sandie Ano, RN

## 2023-04-08 NOTE — Progress Notes (Signed)
Subjective:   Chief complaint: Follow-up for mycobacterial infection   Patient ID: Allison Randall, female    DOB: Sep 05, 1951, 72 y.o.   MRN: [id number]  HPI  Allison Randall is a 72 year-old lady who has a past medical history significant for rheumatoid arthritis, hyperlipidemia HSV infection, who had been getting regular treatment with saline injections to treat varicose veins.  The past these had gone without complication but however this past February she says that the typical erythematous areas in her legs that ensue after injection of the saline failed to resolve and several areas on her upper legs in particular became more erythematous and more inflamed.  She was given several rounds of antibiotics and seen by dermatology who ultimately performed a biopsy which came back with granulomatous pathology as well as evidence of HSV faction with inclusion bodies present and stains positive.  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 Emily Randall.  M chelonae I susceptibilities are shown before and pictured.   The only reasonable options I saw at that time for her would be oral azithromycin 500 mg along with clofazimine 2 tablets once daily.  She started clofazimine and azithromycin.  She seem to be doing pretty well.  Then in June 2022 she went to a very loud country music concert and afterwards noticed that she had loss of hearing and also tinnitus this persisted and continues to the present day and she is visibly hard of hearing in the clinic.  She had looked up possibility of azithromycin causing hearing loss and while rare it can indeed do so.  I had heard of this I had counseled her to stop the antibiotics but she wanted to continue at the time since there were not many antibiotic options.  UNFortunately as predicted off antibiotics her Mycobacterium had begun to cause her problems again and lesions have  grown and one has become purulent. She is for cataract surgery in October as well as come concerned about the fact that she still has soft tissue infection.  I assured her this should not affect her cataract surgery.  We were able to get her an infusion of IV Nuzyra, and now her Medicare drug plan will cover Allison Randall though it is still leaving her with $3300 plus co-pay for the first 30 days of tablets.  We were able to get 15 days of tablets from the drug company an additional 15 days which we have given her.  Another option were contemplating was therapy with clofazimine and bedalaquine but I worried about QT prolonging effects of both the clofazamine and bedalaquine.  She also has been on Zofran and had nausea.  We obtained a twelve-lead EKG at last visit which showed a QT that was prolonged to 422 ms and a QTC of 462 ms with normal sinus rhythm and some preventricular ventricular contractions  Retruned  to clinic for follow-up ==and we obtained a repeat EKG which now shows a QT of 387 and a QTC of 426 ms with normal sinus rhythm.  She continued of improvement in the areas in her legs.  Her hearing loss completely returned to normal.  She then  developed several areas of hyperpigmentation in her legs including the calves thighs arms and upper chest.  She also has dry skin and intense pruritus.  Upon examining them in   at prior visit they seem consistent with a tetracycline induced hyperpigmentation  rash.  In March  wwe stopped her antimycobacterial therapy and have been observing her off therapy.   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  She continues to be followed with dermatology.  She then developed new lesions on her left lower leg, left upper thigh with largest and most tender lesion and lesion in that thigh but also a lesion in her right arm.  Biopsies were performed and pathology from the left lower extremity revealed some  mycobacteria within a granulomatous pathology.  The cultures apparently that were taken did not arrive at proper temperature for the organism to be cultured and so no AFB culture sensitivity was obtained there.  The patient tells me that her right upper arm lesion was biopsied roughly a week prior to her visit with me in May.  Those cultures have indeed yielded Mycobacterium chelonae and sensitivity data is still pending with the lab have been sent to [place].    She has stopped her MTX.  I have referred her to Allison Barefoot, MD at [facility] she has an appoint with him coming up at the end of July.  Interestingly since I last saw her several of these lesions have improved in their appearance despite her not having received therapy that would be directed against mycobacterial infection.  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    Social History   Socioeconomic History   Marital status: Married    Spouse name: Not on file   Number of children: Not on file   Years of education: Not on file   Highest education level: Not on file  Occupational History   Not on file  Tobacco Use   Smoking status: Former    Types: Cigarettes    Quit date: 11/30/2007    Years since quitting: 15.3    Smokeless tobacco: Never  Substance and Sexual Activity   Alcohol use: No   Drug use: No   Sexual activity: Not on file  Other Topics Concern   Not on file  Social History Narrative   Not on file   Social Determinants of Health   Financial Resource Strain: Not on file  Food Insecurity: Not on file  Transportation Needs: Not on file  Physical Activity: Not on file  Stress: Not on file  Social Connections: Not on file    Allergies  Allergen Reactions   Azithromycin Tinitus    Hearing loss and tinnitus   Augmentin [Amoxicillin-Pot Clavulanate] Cough    Redness on face chest and arms    No Known Allergies      Current Outpatient Medications:    atorvastatin (LIPITOR)  20 MG tablet, Take 20 mg by mouth daily at 6 PM., Disp: , Rfl:    Calcium-Vitamin D-Vitamin K 500-100-40 MG-UNT-MCG CHEW, Chew 2 tablets by mouth daily., Disp: , Rfl:    cycloSPORINE (RESTASIS) 0.05 % ophthalmic emulsion, Place 1 drop into both eyes 2 (two) times daily., Disp: , Rfl:    fexofenadine-pseudoephedrine (ALLEGRA-D 24) 180-240 MG 24 hr tablet, Take 1 tablet by mouth daily., Disp: , Rfl:    folic acid (FOLVITE) 1 MG tablet, Take 1 mg by mouth 2 (two) times daily. , Disp: , Rfl:    Krill Oil Omega-3 300 MG CAPS, Take 1 capsule by mouth daily., Disp: , Rfl:    levothyroxine (SYNTHROID, LEVOTHROID) 112 MCG tablet, Take 112 mcg by mouth daily before breakfast., Disp: , Rfl:    losartan-hydrochlorothiazide (HYZAAR) 100-12.5 MG per tablet, Take 1 tablet by mouth daily., Disp: , Rfl:    Lysine 1000 MG TABS, Take 1,000 mg by mouth 2 (two) times daily., Disp: , Rfl:    meloxicam (MOBIC) 7.5 MG tablet, Take 1 tablet by mouth daily., Disp: , Rfl:    methocarbamol (ROBAXIN) 500 MG tablet, Take 1 tablet (500 mg total) by mouth every 6 (six) hours as needed for muscle spasms., Disp: 60 tablet, Rfl: 0   Multiple Vitamin (MULITIVITAMIN WITH MINERALS) TABS, Take 1 tablet by mouth daily., Disp: , Rfl:    Probiotic Product  (PROBIOTIC PO), Take 1 tablet by mouth daily., Disp: , Rfl:    valACYclovir (VALTREX) 500 MG tablet, Take 2,000 mg by mouth 2 (two) times daily as needed (cold sores). , Disp: , Rfl:    zolpidem (AMBIEN) 10 MG tablet, Take 2.5 mg by mouth at bedtime as needed for sleep., Disp: , Rfl:    AMBULATORY NON FORMULARY MEDICATION, Take 100 mg by mouth daily. Medication Name: Clofazimine (Patient not taking: Reported on 04/08/2023), Disp: 100 capsule, Rfl: 2   cholecalciferol (VITAMIN D) 1000 UNITS tablet, Take 1,000 Units by mouth daily., Disp: , Rfl:    methotrexate (RHEUMATREX) 2.5 MG tablet, Take 25 mg by mouth once a week. Takes 10 tablets Caution:Chemotherapy. Protect from light. Takes on Wednesday (Patient not taking: Reported on 03/05/2023), Disp: , Rfl:    Pseudoephedrine HCl (SUDAFED 24 HOUR PO), Take 1 tablet by mouth daily as needed (sinus pressure). (Patient not taking: Reported on 04/08/2023), Disp: , Rfl:    Review of Systems  Constitutional:  Negative for activity change, appetite change, chills, diaphoresis, fatigue, fever and unexpected weight change.  HENT:  Negative for congestion, rhinorrhea, sinus pressure, sneezing, sore throat and trouble swallowing.   Eyes:  Negative for photophobia and visual disturbance.  Respiratory:  Negative for cough, chest tightness, shortness of breath, wheezing and stridor.   Cardiovascular:  Negative for chest pain, palpitations and leg swelling.  Gastrointestinal:  Negative for abdominal distention, abdominal pain, anal bleeding, blood in stool, constipation, diarrhea, nausea and vomiting.  Genitourinary:  Negative for difficulty urinating, dysuria, flank pain and hematuria.  Musculoskeletal:  Negative for arthralgias, back pain, gait problem, joint swelling and myalgias.  Skin:  Positive for color change and rash. Negative for pallor.  Neurological:  Negative for dizziness, tremors, weakness and light-headedness.  Hematological:  Negative for adenopathy.  Does not bruise/bleed easily.  Psychiatric/Behavioral:  Negative for agitation, behavioral problems, confusion, decreased concentration, dysphoric mood and sleep disturbance.        Objective:   Physical Exam Exam conducted with a chaperone present.  Constitutional:  General: She is not in acute distress.    Appearance: Normal appearance. She is well-developed. She is not ill-appearing or diaphoretic.  HENT:     Head: Normocephalic and atraumatic.     Right Ear: Hearing and external ear normal.     Left Ear: Hearing and external ear normal.     Nose: No nasal deformity or rhinorrhea.  Eyes:     General: No scleral icterus.    Conjunctiva/sclera: Conjunctivae normal.     Right eye: Right conjunctiva is not injected.     Left eye: Left conjunctiva is not injected.     Pupils: Pupils are equal, round, and reactive to light.  Neck:     Vascular: No JVD.  Cardiovascular:     Rate and Rhythm: Normal rate and regular rhythm.     Heart sounds: S1 normal and S2 normal.  Pulmonary:     Effort: Pulmonary effort is normal. No respiratory distress.     Breath sounds: No wheezing.  Abdominal:     General: Bowel sounds are normal. There is no distension.     Palpations: Abdomen is soft.     Tenderness: There is no abdominal tenderness.  Musculoskeletal:        General: Normal range of motion.     Right shoulder: Normal.     Left shoulder: Normal.     Cervical back: Normal range of motion and neck supple.     Right hip: Normal.     Left hip: Normal.     Right knee: Normal.     Left knee: Normal.  Lymphadenopathy:     Head:     Right side of head: No submandibular, preauricular or posterior auricular adenopathy.     Left side of head: No submandibular, preauricular or posterior auricular adenopathy.     Cervical: No cervical adenopathy.     Right cervical: No superficial or deep cervical adenopathy.    Left cervical: No superficial or deep cervical adenopathy.  Skin:    General:  Skin is warm and dry.     Coloration: Skin is not pale.     Findings: No abrasion, bruising, ecchymosis, erythema, lesion or rash.     Nails: There is no clubbing.  Neurological:     General: No focal deficit present.     Mental Status: She is alert and oriented to person, place, and time.     Sensory: No sensory deficit.     Coordination: Coordination normal.     Gait: Gait normal.  Psychiatric:        Attention and Perception: She is attentive.        Mood and Affect: Mood normal.        Speech: Speech normal.        Behavior: Behavior normal. Behavior is cooperative.        Thought Content: Thought content normal.        Judgment: Judgment normal.     Lesions that were biopsied and consistent with Mycobacterium infection 03/25/2021:      August 2nd 2022:       06/18/2021:        07/18/2021:      08/02/2021:  Right     Left leg     09/27/2021:  Right leg      Left leg    Right leg December 10, 2021:       Left leg 2028 2023:    Right leg 02/13/2022:  Left leg 02/13/2022:      Hyperpigmented areas throughout arms legs chest consistent with tetracycline induced hypopigmented rash December 10, 2021:        03/05/23:  Right leg chronic lesions not larger     5/23/204:  Left leg    Left thigh    Right arm      Right arm 04/08/23:    Left leg    Left thigh    Recurrent M chelonae nfection with new lesions involving left thigh and also right arm:  Recurrent M chelonae infection now with apparent new lesions in left thigh and potentially right arm:  Very complicated clinical situation.  This recurrence of infection is certainly something we expected though we did not expect she would have new lesions more proximally.  The thigh 1 can make some sense in terms of spread from left lower extremity this area but it may also been that these areas were inoculated with that event that was  something other than the saline infusion she did endorse gardening quite a bit and certainly could have had exposed soil to multiple areas on her skin.  That however is not our biggest issue the largest issue is the multidrug-resistant nature of the organism and her intolerances of different antibiotics were given including azithromycin that may have caught here because hearing loss along with problems with Allison Randall causing a rash.  The bigger problem is higher though would be cost we apparently got her through most of her therapy with samples.  We are awaiting sensitivity data from cultures done at the dermatology office and there in [place] being processed.  Appointment coming up with Dr. Valentina Lucks at [facility]  Hearing loss: Resolved  RA: Off methotrexate  Tetracycline induced rash: Seems stable  I have personally spent 26 minutes involved in face-to-face and non-face-to-face activities for this patient on the day of the visit. Professional time spent includes the following activities: Preparing to see the patient (review of tests), Obtaining and/or reviewing separately obtained history (admission/discharge record), Performing a medically appropriate examination and/or evaluation , Ordering medications/tests/procedures, referring and communicating with other health care professionals, Documenting clinical information in the EMR, Independently interpreting results (not separately reported), Communicating results to the patient/family/caregiver, Counseling and educating the patient/family/caregiver and Care coordination (not separately reported).

## 2023-04-13 ENCOUNTER — Telehealth: Payer: Self-pay

## 2023-04-13 NOTE — Telephone Encounter (Signed)
Patient aware.

## 2023-04-13 NOTE — Telephone Encounter (Signed)
Patient called stating that her rheumatoid arthritis is bothering her. Dr.Beekman office requesting a note or orders if it is okay for patient to resume taking Methotrexate.       Allison Randall, CMA

## 2023-04-22 ENCOUNTER — Telehealth: Payer: Self-pay | Admitting: Infectious Disease

## 2023-04-22 NOTE — Telephone Encounter (Signed)
Results faxed to Dr. Valentina Lucks Fax: [phone]  Juanita Laster, RMA

## 2023-04-22 NOTE — Telephone Encounter (Signed)
M chelonae

## 2023-04-22 NOTE — Telephone Encounter (Signed)
M chelonae Sensis though macrolide not final yet

## 2023-04-23 NOTE — Telephone Encounter (Signed)
Let us know how we can help! - Marchelle Folks

## 2023-05-12 ENCOUNTER — Telehealth: Payer: Self-pay | Admitting: Infectious Disease

## 2023-05-12 NOTE — Telephone Encounter (Signed)
Consult Note from Dr. Valentina Lucks                         So Dr. Valentina Lucks is ending a trial of topical imiquimod to treat her Allison Randall if she does not have disseminated disease he ordered an AFB blood culture that was cooking

## 2023-05-12 NOTE — Telephone Encounter (Signed)
Wow  I learned a lot from his consult; thanks for sharing! Keep [place] in the loop if we do start systemic treatment again or once you receive blood culture results. Marchelle Folks

## 2023-05-18 NOTE — Telephone Encounter (Signed)
This is quite interesting!

## 2023-05-27 ENCOUNTER — Telehealth: Payer: Self-pay

## 2023-05-27 NOTE — Telephone Encounter (Signed)
Patient called office to inform Dr. Daiva Eves that she meet with Dr. Valentina Lucks at [facility]. Is waiting on results before they can move to next steps. Dr. Valentina Lucks advised patient to use warm compress at infected area. Also recommended Aldara cream.  Dr. Valentina Lucks requested patient contact Dr. Daiva Eves regarding Aldara cream. Patient would like to know if provider can send this to [facility] pharmacy on [address].  Juanita Laster, RMA

## 2023-05-28 NOTE — Telephone Encounter (Signed)
Spoke with patient regarding Aldara cream. Informed patient that provider is still waiting on information from [facility] and that he would discuss this at appt.  Verbalized understanding. Juanita Laster, RMA

## 2023-06-07 NOTE — Progress Notes (Signed)
Subjective:   Chief complaint: Follow-up for Mycobacterium chelonae eye infection  Patient ID: Allison Randall, female    DOB: [birth year], 72 y.o.   MRN: [id number]  HPI  Allison Randall is a 72 year-old lady who has a past medical history significant for rheumatoid arthritis, hyperlipidemia HSV infection, who had been getting regular treatment with saline injections to treat varicose veins.  The past these had gone without complication but however this past February she says that the typical erythematous areas in her legs that ensue after injection of the saline failed to resolve and several areas on her upper legs in particular became more erythematous and more inflamed.  She was given several rounds of antibiotics and seen by dermatology who ultimately performed a biopsy which came back with granulomatous pathology as well as evidence of HSV faction with inclusion bodies present and stains positive.  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 Emily Randall.  M chelonae I susceptibilities are shown before and pictured.    Interim history:  The only reasonable options I saw at that time for her would be oral azithromycin 500 mg along with clofazimine 2 Randall once daily.  She started clofazimine and azithromycin.  She seem to be doing pretty well.  Then in June 2022 she went to a very loud country music concert and afterwards noticed that she had loss of hearing and also tinnitus this persisted and continues to the present day and she is visibly hard of hearing in the clinic.  She had looked up possibility of azithromycin causing hearing loss and while rare it can indeed do so.  I had heard of this I had counseled her to stop the antibiotics but she wanted to continue at the time since there were not many antibiotic options.  UNFortunately as predicted off antibiotics her Mycobacterium had begun to cause her  problems again and lesions have grown and one has become purulent. She is for cataract surgery in October as well as come concerned about the fact that she still has soft tissue infection.  I assured her this should not affect her cataract surgery.  We were able to get her an infusion of IV Nuzyra, and now her Medicare drug plan will cover Allison Randall.  We were able to get 15 days of Randall from the drug company an additional 15 days which we have given her.  Another option were contemplating was therapy with clofazimine and bedalaquine but I worried about QT prolonging effects of both the clofazamine and bedalaquine.  She also has been on Zofran and had nausea.  We obtained a twelve-lead EKG at last visit which showed a QT that was prolonged to 422 ms and a QTC of 462 ms with normal sinus rhythm and some preventricular ventricular contractions  Retruned  to clinic for follow-up ==and we obtained a repeat EKG which now shows a QT of 387 and a QTC of 426 ms with normal sinus rhythm.  She continued of improvement in the areas in her legs.  Her hearing loss completely returned to normal.  She then  developed several areas of hyperpigmentation in her legs including the calves thighs arms and upper chest.  She also has dry skin and intense pruritus.  Upon examining them in   at prior visit they seem consistent  with a tetracycline induced hyperpigmentation rash.  In March  wwe stopped her antimycobacterial therapy and have been observing her off therapy.   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  She continues to be followed with dermatology.  She then developed new lesions on her left lower leg, left upper thigh with largest and most tender lesion and lesion in that thigh but also a lesion in her right arm.  Biopsies were performed and pathology from the left  lower extremity revealed some mycobacteria within a granulomatous pathology.  The cultures apparently that were taken did not arrive at proper temperature for the organism to be cultured and so no AFB culture sensitivity was obtained there.  The patient tells me that her right upper arm lesion was biopsied roughly a week prior to her visit with me in May.  Those cultures have indeed yielded Mycobacterium chelonae and sensitivity data is still pending with the lab have been sent to [place].    She has stopped her MTX.  I have referred her to Allison Barefoot, MD at [facility] she has an appoint with him coming up at the end of July.  Interestingly since I last saw her several of these lesions have improved in their appearance despite her not having received therapy that would be directed against mycobacterial infection.  Is a new area on her arm where the biopsy was taken that is become more hyperpigmented    She saw Allison Randall on 05/11/2023. He was concerned that the patient could have disseminated disease, given lesions in multiple locations including now her arms.  He felt first step was to get a  AFB blood culture to determine if she in fact has disseminated infection.  He also ebiopsied the leg so we can get an isolate on hand and send it for susceptibility testing to [place]. He was not optimistic about the prospects for phage therapy here but an isolate would be necessary if this were to be pursued.  He recommended in the interim applying heat therapy and he has apparently cured patients with heat therapy alone. He advised Allison Randall to apply hand warmers to the affected sites for about an hour twice daily. I  In addition to that he recommended topical i imiquimod therapy. He  recommended applying imiquimod cream to each affected area  daily 5 days per week (Monday-Friday).   He recommended starting with  an 8-week course and reassess. He also favored holding the methotrexate.   Her  rheumatologist has given her a prescription for hydroxychloroquine and using that instead is fine by me; there are some QT issues that may complicate future antibiotic treatment but that can be managed with close monitoring.  Susceptibility testing from Dr Dellia Nims office showed usceptible to clarithromycin (MIC 0.25), tobramycin, and linezolid; I to amikacin and doxycycline, R to cefoxitin, ciprofloxacin, moxifloxacin, and imipenem. Favorable MICs to clofazimine (0.25) and tigecycline (0.12). He recommended that if the heat therapy/imiquimod therapy did not work that a 2 drug regimen of zithromycin 250 mg daily plus linezolid 600 mg daily would be a reaonable approach.    [facility] lab this morning and there has been no growth to date which 1 would expect to have already happened given that M chelonae is a rapid grower.  When applying the heat therapy twice a day when she can do sometimes it is only once a day.  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    Social History   Socioeconomic History   Marital status: Married    Spouse name: Not on file   Number of children: Not on file   Years of education: Not on file   Highest education level: Not on file  Occupational History   Not on file  Tobacco Use    Smoking status: Former    Current packs/day: 0.00    Types: Cigarettes    Quit date: 11/30/2007    Years since quitting: 15.5   Smokeless tobacco: Never  Substance and Sexual Activity   Alcohol use: No   Drug use: No   Sexual activity: Not on file  Other Topics Concern   Not on file  Social History Narrative   Not on file   Social Determinants of Health   Financial Resource Strain: Low Risk  (07/29/2022)   [facility] visits prior to 12/13/2022.   Overall Financial Resource Strain (CARDIA)    Difficulty of Paying Living Expenses: Not hard at all  Food Insecurity: No Food Insecurity (05/11/2023)   Received from [facility]   Hunger Vital Sign    Worried About Running Out of Food in the Last Year: Never true    Ran Out of Food in the Last Year: Never true  Transportation Needs: No Transportation Needs (07/29/2022)   [facility] prior to 12/13/2022[facility] visits prior to 12/13/2022.   PRAPARE - Administrator, Civil Service (Medical): No    Lack of Transportation (Non-Medical): No  Physical Activity: Insufficiently Active (07/29/2022)   [facility] prior to 12/13/2022[facility] visits prior to 12/13/2022.   Exercise Vital Sign    Days of Exercise per Week: 2 days    Minutes of Exercise per Session: 60 min  Stress: No Stress Concern Present (07/29/2022)   [facility] to [facility]13/2022[facility] visits prior to 12/13/2022.   [employer] of Occupational Health - Occupational Stress Questionnaire    Feeling of Stress : Not at all  Social  Connections: Moderately Integrated (07/29/2022)   [facility] prior to 12/13/2022[facility] visits prior to 12/13/2022.   Social Connection and Isolation Panel [NHANES]    Frequency of Communication with Friends and Family: Three times a week    Frequency of Social Gatherings with Friends  and Family: Once a week    Attends Religious Services: 1 to 4 times per year    Active Member of Clubs or Organizations: No    Attends Engineer, structural: Patient declined    Marital Status: Married    Allergies  Allergen Reactions   Azithromycin Tinitus    Hearing loss and tinnitus   Augmentin [Amoxicillin-Pot Clavulanate] Cough    Redness on face chest and arms    No Known Allergies      Current Outpatient Medications:    AMBULATORY NON FORMULARY MEDICATION, Take 100 mg by mouth daily. Medication Name: Clofazimine (Patient not taking: Reported on 04/08/2023), Disp: 100 capsule, Rfl: 2   atorvastatin (LIPITOR) 20 MG tablet, Take 20 mg by mouth daily at 6 PM., Disp: , Rfl:    Calcium-Vitamin D-Vitamin K 500-100-40 MG-UNT-MCG CHEW, Chew 2 Randall by mouth daily., Disp: , Rfl:    cholecalciferol (VITAMIN D) 1000 UNITS tablet, Take 1,000 Units by mouth daily., Disp: , Rfl:    cycloSPORINE (RESTASIS) 0.05 % ophthalmic emulsion, Place 1 drop into both eyes 2 (two) times daily., Disp: , Rfl:    fexofenadine-pseudoephedrine (ALLEGRA-D 24) 180-240 MG 24 hr tablet, Take 1 tablet by mouth daily., Disp: , Rfl:    folic acid (FOLVITE) 1 MG tablet, Take 1 mg by mouth 2 (two) times daily. , Disp: , Rfl:    Krill Oil Omega-3 300 MG CAPS, Take 1 capsule by mouth daily., Disp: , Rfl:    levothyroxine (SYNTHROID, LEVOTHROID) 112 MCG tablet, Take 112 mcg by mouth daily before breakfast., Disp: , Rfl:    losartan-hydrochlorothiazide (HYZAAR) 100-12.5 MG per tablet, Take 1 tablet by mouth daily., Disp: , Rfl:     Lysine 1000 MG TABS, Take 1,000 mg by mouth 2 (two) times daily., Disp: , Rfl:    methocarbamol (ROBAXIN) 500 MG tablet, Take 1 tablet (500 mg total) by mouth every 6 (six) hours as needed for muscle spasms., Disp: 60 tablet, Rfl: 0   methotrexate (RHEUMATREX) 2.5 MG tablet, Take 25 mg by mouth once a week. Takes 10 Randall Caution:Chemotherapy. Protect from light. Takes on Wednesday (Patient not taking: Reported on 03/05/2023), Disp: , Rfl:    Multiple Vitamin (MULITIVITAMIN WITH MINERALS) TABS, Take 1 tablet by mouth daily., Disp: , Rfl:    Probiotic Product (PROBIOTIC PO), Take 1 tablet by mouth daily., Disp: , Rfl:    Pseudoephedrine HCl (SUDAFED 24 HOUR PO), Take 1 tablet by mouth daily as needed (sinus pressure). (Patient not taking: Reported on 04/08/2023), Disp: , Rfl:    valACYclovir (VALTREX) 500 MG tablet, Take 2,000 mg by mouth 2 (two) times daily as needed (cold sores). , Disp: , Rfl:    zolpidem (AMBIEN) 10 MG tablet, Take 2.5 mg by mouth at bedtime as needed for sleep., Disp: , Rfl:    Review of Systems  Constitutional:  Negative for activity change, appetite change, chills, diaphoresis, fatigue, fever and unexpected weight change.  HENT:  Negative for congestion, rhinorrhea, sinus pressure, sneezing, sore throat and trouble swallowing.   Eyes:  Negative for photophobia and visual disturbance.  Respiratory:  Negative for cough, chest tightness, shortness of breath, wheezing and stridor.   Cardiovascular:  Negative for chest pain, palpitations and leg swelling.  Gastrointestinal:  Negative for abdominal distention, abdominal pain, anal bleeding, blood in stool, constipation, diarrhea, nausea and vomiting.  Genitourinary:  Negative for difficulty urinating, dysuria, flank pain and hematuria.  Musculoskeletal:  Negative for arthralgias, back pain, gait problem,  joint swelling and myalgias.  Skin:  Positive for rash. Negative for color change, pallor and wound.  Neurological:  Negative  for dizziness, tremors, weakness and light-headedness.  Hematological:  Negative for adenopathy. Does not bruise/bleed easily.  Psychiatric/Behavioral:  Negative for agitation, behavioral problems, confusion, decreased concentration, dysphoric mood and sleep disturbance.        Objective:   Physical Exam Constitutional:      General: She is not in acute distress.    Appearance: Normal appearance. She is well-developed. She is not ill-appearing or diaphoretic.  HENT:     Head: Normocephalic and atraumatic.     Right Ear: Hearing and external ear normal.     Left Ear: Hearing and external ear normal.     Nose: No nasal deformity or rhinorrhea.  Eyes:     General: No scleral icterus.    Conjunctiva/sclera: Conjunctivae normal.     Right eye: Right conjunctiva is not injected.     Left eye: Left conjunctiva is not injected.     Pupils: Pupils are equal, round, and reactive to light.  Neck:     Vascular: No JVD.  Cardiovascular:     Rate and Rhythm: Normal rate and regular rhythm.     Heart sounds: S1 normal and S2 normal.  Abdominal:     General: Bowel sounds are normal. There is no distension.     Palpations: Abdomen is soft.     Tenderness: There is no abdominal tenderness.  Musculoskeletal:        General: Normal range of motion.     Right shoulder: Normal.     Left shoulder: Normal.     Cervical back: Normal range of motion and neck supple.     Right hip: Normal.     Left hip: Normal.     Right knee: Normal.     Left knee: Normal.  Lymphadenopathy:     Head:     Right side of head: No submandibular, preauricular or posterior auricular adenopathy.     Left side of head: No submandibular, preauricular or posterior auricular adenopathy.     Cervical: No cervical adenopathy.     Right cervical: No superficial or deep cervical adenopathy.    Left cervical: No superficial or deep cervical adenopathy.  Skin:    General: Skin is warm and dry.     Coloration: Skin is not pale.      Findings: No abrasion, bruising, ecchymosis, erythema, lesion or rash.     Nails: There is no clubbing.  Neurological:     General: No focal deficit present.     Mental Status: She is alert and oriented to person, place, and time.     Sensory: No sensory deficit.     Coordination: Coordination normal.     Gait: Gait normal.  Psychiatric:        Attention and Perception: She is attentive.        Mood and Affect: Mood normal.        Speech: Speech normal.        Behavior: Behavior normal. Behavior is cooperative.        Thought Content: Thought content normal.        Judgment: Judgment normal.     Lesions that were biopsied and consistent with Mycobacterium infection 03/25/2021:      August 2nd 2022:       06/18/2021:        07/18/2021:  08/02/2021:  Right     Left leg     09/27/2021:  Right leg      Left leg    Right leg December 10, 2021:       Left leg 2028 2023:    Right leg 02/13/2022:       Left leg 02/13/2022:      Hyperpigmented areas throughout arms legs chest consistent with tetracycline induced hypopigmented rash December 10, 2021:        03/05/23:  Right leg chronic lesions not larger     5/23/204:  Left leg    Left thigh    Right arm      Right arm 04/08/23:    Left leg    Left thigh      Left thigh 06/08/2023:       Left leg where biopsy was performed by Dr. Valentina Lucks    Right arm    Right leg      Mycobacterium chelonae infection:  Based on the fact that the blood cultures have not yielded organism nearly a month in she should not have disseminated infection.  Regardless are going to proceed with topical therapy which I wish I had initiated earlier.  Will do Aldara 5% cream applied to these areas 5 days a week along with her applying heat therapy for an hour twice daily.  Will then reassess at roughly the 8-week mark.  But time her  cultures will been finalized.  If we need to resort to treatment we will go with Dr. Maretta Los recommendation of azithromycin lower dose 250 mg along with Zyvox --- note the organism was intermediate to Zyvox in the past but now sensitive.    Hearing loss: Resolved  Rheumatoid arthritis off methotrexate and also not on Plaquenil due to her concerns about QT prolongation with this and with addition of azithromycin  Tetracycline induced rash seems stable.  I have personally spent 42 minutes involved in face-to-face and non-face-to-face activities for this patient on the day of the visit. Professional time spent includes the following activities: Preparing to see the patient (review of tests), Obtaining and/or reviewing separately obtained history (admission/discharge record), Performing a medically appropriate examination and/or evaluation , Ordering medications/tests/procedures, referring and communicating with other health care professionals, Documenting clinical information in the EMR, Independently interpreting results (not separately reported), Communicating results to the patient/family/caregiver, Counseling and educating the patient/family/caregiver and Care coordination (not separately reported).

## 2023-06-08 ENCOUNTER — Other Ambulatory Visit: Payer: Self-pay

## 2023-06-08 ENCOUNTER — Encounter: Payer: Self-pay | Admitting: Infectious Disease

## 2023-06-08 ENCOUNTER — Ambulatory Visit (INDEPENDENT_AMBULATORY_CARE_PROVIDER_SITE_OTHER): Payer: Medicare Other | Admitting: Infectious Disease

## 2023-06-08 VITALS — BP 143/89 | HR 98 | Wt 144.8 lb

## 2023-06-08 DIAGNOSIS — R110 Nausea: Secondary | ICD-10-CM

## 2023-06-08 DIAGNOSIS — M0520 Rheumatoid vasculitis with rheumatoid arthritis of unspecified site: Secondary | ICD-10-CM | POA: Diagnosis not present

## 2023-06-08 DIAGNOSIS — L270 Generalized skin eruption due to drugs and medicaments taken internally: Secondary | ICD-10-CM | POA: Diagnosis not present

## 2023-06-08 DIAGNOSIS — F5109 Other insomnia not due to a substance or known physiological condition: Secondary | ICD-10-CM | POA: Diagnosis not present

## 2023-06-08 DIAGNOSIS — B009 Herpesviral infection, unspecified: Secondary | ICD-10-CM | POA: Diagnosis not present

## 2023-06-08 DIAGNOSIS — H9193 Unspecified hearing loss, bilateral: Secondary | ICD-10-CM | POA: Diagnosis not present

## 2023-06-08 DIAGNOSIS — A318 Other mycobacterial infections: Secondary | ICD-10-CM | POA: Diagnosis present

## 2023-06-08 HISTORY — DX: Generalized skin eruption due to drugs and medicaments taken internally: L27.0

## 2023-06-08 MED ORDER — IMIQUIMOD 5 % EX CREA: TOPICAL_CREAM | CUTANEOUS | 0 refills | Status: DC

## 2023-06-09 ENCOUNTER — Telehealth: Payer: Self-pay

## 2023-06-09 NOTE — Telephone Encounter (Signed)
PA initiated for Imiquimod with Covermymed Key: [id number]

## 2023-06-09 NOTE — Telephone Encounter (Signed)
Patient caleld stating that the medication wasn't in stock at pharmacy. Once in stock patient will be able to pick up. No PA needed.    Allison Randall Lesli Albee, CMA

## 2023-06-09 NOTE — Telephone Encounter (Signed)
Patient states she is having issues picking up medication at the pharmacy. Staff contacted pharmacy who states medication will need a PA order greater than 30 day supply. Awaiting PA request from pharmacy to be faxed.  Valarie Cones, LPN

## 2023-06-16 ENCOUNTER — Other Ambulatory Visit: Payer: Self-pay | Admitting: Infectious Disease

## 2023-06-16 MED ORDER — IMIQUIMOD 5 % EX CREA: TOPICAL_CREAM | CUTANEOUS | 0 refills | Status: DC

## 2023-07-02 ENCOUNTER — Emergency Department (HOSPITAL_BASED_OUTPATIENT_CLINIC_OR_DEPARTMENT_OTHER): Payer: Medicare Other

## 2023-07-02 ENCOUNTER — Other Ambulatory Visit: Payer: Self-pay

## 2023-07-02 ENCOUNTER — Emergency Department (HOSPITAL_BASED_OUTPATIENT_CLINIC_OR_DEPARTMENT_OTHER)
Admission: EM | Admit: 2023-07-02 | Discharge: 2023-07-02 | Disposition: A | Discharge status: Home / Self Care | Payer: Medicare Other | Attending: Emergency Medicine | Admitting: Emergency Medicine

## 2023-07-02 DIAGNOSIS — L03115 Cellulitis of right lower limb: Secondary | ICD-10-CM | POA: Insufficient documentation

## 2023-07-02 DIAGNOSIS — M79604 Pain in right leg: Secondary | ICD-10-CM | POA: Diagnosis present

## 2023-07-02 LAB — CBC
HCT: 44.7 % (ref 36.0–46.0)
Hemoglobin: 14.8 g/dL (ref 12.0–15.0)
MCH: 29.2 pg (ref 26.0–34.0)
MCHC: 33.1 g/dL (ref 30.0–36.0)
MCV: 88.3 fL (ref 80.0–100.0)
Platelets: 204 10*3/uL (ref 150–400)
RBC: 5.06 MIL/uL (ref 3.87–5.11)
RDW: 14.2 % (ref 11.5–15.5)
WBC: 9.7 10*3/uL (ref 4.0–10.5)
nRBC: 0 % (ref 0.0–0.2)

## 2023-07-02 LAB — BASIC METABOLIC PANEL WITH GFR
Anion gap: 7 (ref 5–15)
BUN: 23 mg/dL (ref 8–23)
CO2: 28 mmol/L (ref 22–32)
Calcium: 8.9 mg/dL (ref 8.9–10.3)
Chloride: 104 mmol/L (ref 98–111)
Creatinine, Ser: 0.84 mg/dL (ref 0.44–1.00)
GFR, Estimated: >60 mL/min
Glucose, Bld: 93 mg/dL (ref 70–99)
Potassium: 3.7 mmol/L (ref 3.5–5.1)
Sodium: 139 mmol/L (ref 135–145)

## 2023-07-02 MED ORDER — DOXYCYCLINE HYCLATE 100 MG PO CAPS: 100.0000 mg | ORAL_CAPSULE | Freq: Two times a day (BID) | ORAL | 0 refills | Status: DC

## 2023-07-02 NOTE — ED Provider Notes (Signed)
[place] [facility] DEPARTMENT AT [facility] Provider Note   CSN: [id number] Arrival date & time: 07/02/23  1247     History  Chief Complaint  Patient presents with   Leg Pain    right   Leg Swelling    Allison Randall is a 72 y.o. female.  72 year old female with a history of rheumatoid arthritis not currently on any immunosuppressants as well as HSV who presents to the emergency department with right lower extremity pain and swelling.  Patient reports that on Tuesday she started experiencing pain of her right lower extremity.  Worse in the morning and describes it as a tingling sensation.  Also noticed that it started becoming red.  Walking does not make it worse.  No injuries.  No fevers, chest pain, cough, or shortness of breath.  No history of DVT or PE, recent surgery, or history of cancer aside from basal cell carcinoma and skin cancer.  PCP referred her to the emergency department for evaluation of a DVT.       Home Medications Prior to Admission medications   Medication Sig Start Date End Date Taking? Authorizing Provider  doxycycline (VIBRAMYCIN) 100 MG capsule Take 1 capsule (100 mg total) by mouth 2 (two) times daily. 07/02/23  Yes Rondel Baton, MD  atorvastatin (LIPITOR) 20 MG tablet Take 20 mg by mouth daily at 6 PM.    [provider]  Calcium-Vitamin D-Vitamin K 500-100-40 MG-UNT-MCG CHEW Chew 2 tablets by mouth daily.    [provider]  cholecalciferol (VITAMIN D) 1000 UNITS tablet Take 1,000 Units by mouth daily.    [provider]  cycloSPORINE (RESTASIS) 0.05 % ophthalmic emulsion Place 1 drop into both eyes 2 (two) times daily.    [provider]  fexofenadine-pseudoephedrine (ALLEGRA-D 24) 180-240 MG 24 hr tablet Take 1 tablet by mouth daily.    [provider]  folic acid (FOLVITE) 1 MG tablet Take 1 mg by mouth 2 (two) times daily.     [provider]  imiquimod Mathis Dad) 5 % cream Apply to  affected areas 5 days every week x 60 days 06/16/23   Daiva Eves, Lisette Grinder, MD  Krill Oil Omega-3 300 MG CAPS Take 1 capsule by mouth daily.    [provider]  levothyroxine (SYNTHROID, LEVOTHROID) 112 MCG tablet Take 112 mcg by mouth daily before breakfast.    [provider]  losartan-hydrochlorothiazide (HYZAAR) 100-12.5 MG per tablet Take 1 tablet by mouth daily.    [provider]  Lysine 1000 MG TABS Take 1,000 mg by mouth 2 (two) times daily.    [provider]  methocarbamol (ROBAXIN) 500 MG tablet Take 1 tablet (500 mg total) by mouth every 6 (six) hours as needed for muscle spasms. 11/22/16   Shirlean Kelly, MD  Multiple Vitamin (MULITIVITAMIN WITH MINERALS) TABS Take 1 tablet by mouth daily.    [provider]  Probiotic Product (PROBIOTIC PO) Take 1 tablet by mouth daily.    [provider]  Pseudoephedrine HCl (SUDAFED 24 HOUR PO) Take 1 tablet by mouth daily as needed (sinus pressure).    [provider]  valACYclovir (VALTREX) 500 MG tablet Take 2,000 mg by mouth 2 (two) times daily as needed (cold sores).  09/08/16   [provider]  zolpidem (AMBIEN) 10 MG tablet Take 2.5 mg by mouth at bedtime as needed for sleep. 08/27/16   [provider]      Allergies  Azithromycin, Augmentin [amoxicillin-pot clavulanate], and No known allergies    Review of Systems   Review of Systems  Physical Exam Updated Vital Signs BP 127/75   Pulse 87   Temp 98 F (36.7 C) (Temporal)   Resp 20   Ht 5\' 4"  (1.626 m)   Wt 65.7 kg   SpO2 97%   BMI 24.86 kg/m  Physical Exam Vitals and nursing note reviewed.  Constitutional:      General: She is not in acute distress.    Appearance: She is well-developed.  HENT:     Head: Normocephalic and atraumatic.     Right Ear: External ear normal.     Left Ear: External ear normal.     Nose: Nose normal.  Eyes:     Extraocular Movements: Extraocular movements  intact.     Conjunctiva/sclera: Conjunctivae normal.     Pupils: Pupils are equal, round, and reactive to light.  Cardiovascular:     Rate and Rhythm: Normal rate and regular rhythm.     Heart sounds: No murmur heard. Pulmonary:     Effort: Pulmonary effort is normal. No respiratory distress.     Breath sounds: Normal breath sounds.  Musculoskeletal:     Cervical back: Normal range of motion and neck supple.     Right lower leg: Edema (Trace) present.     Left lower leg: No edema.     Comments: DP pulses 2+ bilaterally  Skin:    General: Skin is warm and dry.     Comments: Rash on anterior and lateral right lower extremity.  See images below.  No pain or proportion to exam.  No crepitance.  Neurological:     Mental Status: She is alert and oriented to person, place, and time. Mental status is at baseline.  Psychiatric:        Mood and Affect: Mood normal.      ED Results / Procedures / Treatments   Labs (all labs ordered are listed, but only abnormal results are displayed) Labs Reviewed  CBC  BASIC METABOLIC PANEL    EKG None  Radiology US Venous Img Lower Unilateral Right  Result Date: 07/02/2023 CLINICAL DATA:  Right lower extremity pain and edema. EXAM: RIGHT LOWER EXTREMITY VENOUS DOPPLER ULTRASOUND TECHNIQUE: Gray-scale sonography with graded compression, as well as color Doppler and duplex ultrasound were performed to evaluate the lower extremity deep venous systems from the level of the common femoral vein and including the common femoral, femoral, profunda femoral, popliteal and calf veins including the posterior tibial, peroneal and gastrocnemius veins when visible. The superficial great saphenous vein was also interrogated. Spectral Doppler was utilized to evaluate flow at rest and with distal augmentation maneuvers in the common femoral, femoral and popliteal veins. COMPARISON:  None Available. FINDINGS: Contralateral Common Femoral Vein: Respiratory phasicity is  normal and symmetric with the symptomatic side. No evidence of thrombus. Normal compressibility. Common Femoral Vein: No evidence of thrombus. Normal compressibility, respiratory phasicity and response to augmentation. Saphenofemoral Junction: No evidence of thrombus. Normal compressibility and flow on color Doppler imaging. Profunda Femoral Vein: No evidence of thrombus. Normal compressibility and flow on color Doppler imaging. Femoral Vein: No evidence of thrombus. Normal compressibility, respiratory phasicity and response to augmentation. Popliteal Vein: No evidence of thrombus. Normal compressibility, respiratory phasicity and response to augmentation. Calf Veins: No evidence of thrombus. Normal compressibility and flow on color Doppler imaging. Superficial Great Saphenous Vein: No evidence of thrombus. Normal compressibility. Venous Reflux:  None. Other  Findings: No evidence of superficial thrombophlebitis or abnormal fluid collection. IMPRESSION: No evidence of right lower extremity deep venous thrombosis. Electronically Signed   By: Irish Lack M.D.   On: 07/02/2023 15:53    Procedures Procedures    Medications Ordered in ED Medications - No data to display  ED Course/ Medical Decision Making/ A&P                                 Medical Decision Making Amount and/or Complexity of Data Reviewed Labs: ordered.  Risk Prescription drug management.   Allison Randall is a 72 y.o. 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   Initial Ddx:  Cellulitis, DVT, sepsis, necrotizing fasciitis  MDM/Course:  Patient presents to the emergency department with mild redness and pain of her right lower extremity.  On exam does have some trace edema and some erythema.  DVT ultrasound did not show any evidence of  blood clot.  Feel that she likely has cellulitis.  No signs of necrotizing fasciitis on exam.  No fever and is not septic at this time.  Will go ahead and give her antibiotics to take and have her follow-up with her primary doctor for wound check.   This patient presents to the ED for concern of complaints listed in HPI, this involves an extensive number of treatment options, and is a complaint that carries with it a high risk of complications and morbidity. Disposition including potential need for admission considered.   Dispo: DC Home. Return precautions discussed including, but not limited to, those listed in the AVS. Allowed pt time to ask questions which were answered fully prior to dc.  Records reviewed Outpatient Clinic Notes The following labs were independently interpreted: Chemistry and show no acute abnormality I have reviewed the patients home medications and made adjustments as needed Social Determinants of health:  Elderly         Final Clinical Impression(s) / ED Diagnoses Final diagnoses:  Cellulitis of right lower extremity    Rx / DC Orders ED Discharge Orders          Ordered    doxycycline (VIBRAMYCIN) 100 MG capsule  2 times daily        07/02/23 1623              Rondel Baton, MD 07/03/23 1118

## 2023-07-02 NOTE — ED Triage Notes (Signed)
Patient arrives with complaints of increased right leg pain and swelling. Warm and red to site. Sent here by her PCP to rule out a blood clot.  Rates pain a 1/10. Worse pain in the morning.  No recent falls or injuries.

## 2023-07-02 NOTE — Discharge Instructions (Signed)
You were seen for skin infection (cellulitis) in the emergency department.   At home, please take the antibiotics (doxycycline) we have prescribed you.  You may also take tylenol and ibuprofen for any pain that you have.   Check your MyChart online for the results of any tests that had not resulted by the time you left the emergency department.   Follow-up with your primary doctor in 2-3 days regarding your visit.    Return immediately to the emergency department if you experience any of the following: fevers, severe pain, rapid spread of the rash/redness, or any other concerning symptoms.    Thank you for visiting our Emergency Department. It was a pleasure taking care of you today.

## 2023-07-02 NOTE — ED Notes (Signed)
Dc instructions reviewed with patient. Patient voiced understanding. Dc with belongings.  °

## 2023-07-11 ENCOUNTER — Emergency Department (HOSPITAL_BASED_OUTPATIENT_CLINIC_OR_DEPARTMENT_OTHER): Payer: Medicare Other

## 2023-07-11 ENCOUNTER — Emergency Department (HOSPITAL_BASED_OUTPATIENT_CLINIC_OR_DEPARTMENT_OTHER)
Admission: EM | Admit: 2023-07-11 | Discharge: 2023-07-11 | Disposition: A | Discharge status: Home / Self Care | Payer: Medicare Other | Attending: Emergency Medicine | Admitting: Emergency Medicine

## 2023-07-11 ENCOUNTER — Encounter (HOSPITAL_BASED_OUTPATIENT_CLINIC_OR_DEPARTMENT_OTHER): Payer: Self-pay | Admitting: Emergency Medicine

## 2023-07-11 ENCOUNTER — Other Ambulatory Visit: Payer: Self-pay

## 2023-07-11 ENCOUNTER — Emergency Department (HOSPITAL_BASED_OUTPATIENT_CLINIC_OR_DEPARTMENT_OTHER): Payer: Medicare Other | Admitting: Radiology

## 2023-07-11 DIAGNOSIS — T148XXA Other injury of unspecified body region, initial encounter: Secondary | ICD-10-CM

## 2023-07-11 DIAGNOSIS — W01198A Fall on same level from slipping, tripping and stumbling with subsequent striking against other object, initial encounter: Secondary | ICD-10-CM | POA: Diagnosis not present

## 2023-07-11 DIAGNOSIS — I10 Essential (primary) hypertension: Secondary | ICD-10-CM | POA: Insufficient documentation

## 2023-07-11 DIAGNOSIS — Z79899 Other long term (current) drug therapy: Secondary | ICD-10-CM | POA: Insufficient documentation

## 2023-07-11 DIAGNOSIS — S0990XA Unspecified injury of head, initial encounter: Secondary | ICD-10-CM | POA: Diagnosis present

## 2023-07-11 DIAGNOSIS — W19XXXA Unspecified fall, initial encounter: Secondary | ICD-10-CM

## 2023-07-11 DIAGNOSIS — Z23 Encounter for immunization: Secondary | ICD-10-CM | POA: Insufficient documentation

## 2023-07-11 DIAGNOSIS — M79642 Pain in left hand: Secondary | ICD-10-CM

## 2023-07-11 DIAGNOSIS — S0003XA Contusion of scalp, initial encounter: Secondary | ICD-10-CM

## 2023-07-11 MED ORDER — TETANUS-DIPHTH-ACELL PERTUSSIS 5-2.5-18.5 LF-MCG/0.5 IM SUSY
0.5000 mL | PREFILLED_SYRINGE | Freq: Once | INTRAMUSCULAR | Status: AC
  Administered 2023-07-11: 0.5 mL via INTRAMUSCULAR
  Filled 2023-07-11: qty 0.5

## 2023-07-11 NOTE — ED Triage Notes (Signed)
Pt reports fall pta. Pt c/o RLE anterior distal abrasion and laceration, LUE abrasion and wrist pain, Posterior head pain. Denies loc or thinners, ao x4

## 2023-07-11 NOTE — Discharge Instructions (Addendum)
You were seen in the emergency room today for evaluation after your fall.  Your CT imaging does not show any acute changes however you should follow-up with your neurosurgeon for your back imaging.  You likely may experience some aches and pains still from your fall.  If you continue to have some aches and pains, I did give you follow-up with orthopedic, however you can follow-up with your primary care provider as well.  Recommend taking Tylenol and/or ibuprofen as needed for pain.  You can also ice your head for pain as well.  The Steri-Strips should fall off in the next few days.  Please make sure you are cleaning the wound daily with Dial soap and water and keeping it dry.  Please make sure you follow-up with your PCP within the week for wound reevaluation.  I have included information on head injuries into the discharge paperwork.  Please review.  If you have any concerns, new or worsening symptoms, please return to the nearest emergency department for evaluation.  Contact a doctor if: These symptoms do not go away: Headaches. Dizziness. Double vision or vision changes. Trouble sleeping. Changes in mood. You have new symptoms. Get help right away if: You have sudden: Headache that is very bad. Vomiting that does not stop. Changes in the size of one of your pupils. Pupils are the black centers of your eyes. Changes in how you see (vision). More confusion or more grumpy moods. You have a seizure. Your symptoms get worse. You have a clear or bloody fluid coming from your nose or ears. These symptoms may be an emergency. Get help right away. Call 911. Do not wait to see if the symptoms will go away. Do not drive yourself to the hospital.

## 2023-07-11 NOTE — ED Provider Notes (Signed)
[facility] Provider Note   CSN: [id number] Arrival date & time: 07/11/23  1611     History {Add pertinent medical, surgical, social history, OB history to HPI:1} Chief Complaint  Patient presents with   Allison Randall is a 72 y.o. female.   Fall       Home Medications Prior to Admission medications   Medication Sig Start Date End Date Taking? Authorizing Provider  atorvastatin (LIPITOR) 20 MG tablet Take 20 mg by mouth daily at 6 PM.    [provider]  Calcium-Vitamin D-Vitamin K 500-100-40 MG-UNT-MCG CHEW Chew 2 tablets by mouth daily.    [provider]  cholecalciferol (VITAMIN D) 1000 UNITS tablet Take 1,000 Units by mouth daily.    [provider]  cycloSPORINE (RESTASIS) 0.05 % ophthalmic emulsion Place 1 drop into both eyes 2 (two) times daily.    [provider]  doxycycline (VIBRAMYCIN) 100 MG capsule Take 1 capsule (100 mg total) by mouth 2 (two) times daily. 07/02/23   Rondel Baton, MD  fexofenadine-pseudoephedrine (ALLEGRA-D 24) 180-240 MG 24 hr tablet Take 1 tablet by mouth daily.    [provider]  folic acid (FOLVITE) 1 MG tablet Take 1 mg by mouth 2 (two) times daily.     [provider]  imiquimod Mathis Dad) 5 % cream Apply to affected areas 5 days every week x 60 days 06/16/23   Daiva Eves, Lisette Grinder, MD  Krill Oil Omega-3 300 MG CAPS Take 1 capsule by mouth daily.    [provider]  levothyroxine (SYNTHROID, LEVOTHROID) 112 MCG tablet Take 112 mcg by mouth daily before breakfast.    [provider]  losartan-hydrochlorothiazide (HYZAAR) 100-12.5 MG per tablet Take 1 tablet by mouth daily.    [provider]  Lysine 1000 MG TABS Take 1,000 mg by mouth 2 (two) times daily.    [provider]  methocarbamol (ROBAXIN) 500 MG tablet Take 1 tablet (500 mg total) by mouth every 6 (six) hours as needed for muscle spasms.  11/22/16   Shirlean Kelly, MD  Multiple Vitamin (MULITIVITAMIN WITH MINERALS) TABS Take 1 tablet by mouth daily.    [provider]  Probiotic Product (PROBIOTIC PO) Take 1 tablet by mouth daily.    [provider]  Pseudoephedrine HCl (SUDAFED 24 HOUR PO) Take 1 tablet by mouth daily as needed (sinus pressure).    [provider]  valACYclovir (VALTREX) 500 MG tablet Take 2,000 mg by mouth 2 (two) times daily as needed (cold sores).  09/08/16   [provider]  zolpidem (AMBIEN) 10 MG tablet Take 2.5 mg by mouth at bedtime as needed for sleep. 08/27/16   [provider]      Allergies    Azithromycin, Augmentin [amoxicillin-pot clavulanate], and No known allergies    Review of Systems   Review of Systems  Physical Exam Updated Vital Signs BP (!) 161/73   Pulse 83   Resp 16   Ht 5\' 4"  (1.626 m)   Wt 65.8 kg   SpO2 100%   BMI 24.89 kg/m  Physical Exam  ED Results / Procedures / Treatments   Labs (all labs ordered are listed, but only abnormal results are displayed) Labs Reviewed - No data to display  EKG None  Radiology DG Chest 1 View  Result Date: 07/11/2023 CLINICAL DATA:  Fall and trauma. EXAM: CHEST  1 VIEW COMPARISON:  Chest radiograph dated  03/03/2023. FINDINGS: No focal consolidation, pleural effusion, pneumothorax. The cardiac silhouette is within normal limits. No acute osseous pathology. Degenerative changes of the spine. IMPRESSION: No active disease. Electronically Signed   By: Elgie Collard M.D.   On: 07/11/2023 18:03   DG Wrist Complete Left  Result Date: 07/11/2023 CLINICAL DATA:  Fall prior to admission. Abrasions and lacerations of the right lower and left upper extremities. EXAM: LEFT WRIST - COMPLETE 3+ VIEW; RIGHT TIBIA AND FIBULA - 2 VIEW; PELVIS - 1-2 VIEW; LEFT HAND - COMPLETE 3+ VIEW COMPARISON:  Limited correlation made with lumbar spine radiographs 07/03/2021 and CT of the lumbar spine 07/11/2023.  FINDINGS: AP pelvis: No evidence of acute fracture, dislocation or femoral head osteonecrosis. The hip joint spaces are preserved. There are postsurgical and degenerative changes in the lower lumbar spine associated with a convex right scoliosis. Left wrist and hand: The bones appear mildly demineralized. No evidence of acute fracture or dislocation. The joint spaces are preserved. No evidence of foreign body or soft tissue emphysema. Right lower leg: The bones appear mildly demineralized. No evidence of acute fracture or dislocation. There are mild tricompartmental degenerative changes at the right knee. Probable superficial laceration along the anteromedial aspect of the proximal tibia with underlying soft tissue emphysema. No foreign body demonstrated. There is mild nonspecific generalized soft tissue edema. IMPRESSION: 1. No evidence of acute fracture or dislocation in the pelvis, right lower leg, left wrist or left hand. 2. Probable superficial laceration along the anteromedial aspect of the proximal right tibia without evidence of foreign body. Electronically Signed   By: Carey Bullocks M.D.   On: 07/11/2023 17:57   DG Hand Complete Left  Result Date: 07/11/2023 CLINICAL DATA:  Fall prior to admission. Abrasions and lacerations of the right lower and left upper extremities. EXAM: LEFT WRIST - COMPLETE 3+ VIEW; RIGHT TIBIA AND FIBULA - 2 VIEW; PELVIS - 1-2 VIEW; LEFT HAND - COMPLETE 3+ VIEW COMPARISON:  Limited correlation made with lumbar spine radiographs 07/03/2021 and CT of the lumbar spine 07/11/2023. FINDINGS: AP pelvis: No evidence of acute fracture, dislocation or femoral head osteonecrosis. The hip joint spaces are preserved. There are postsurgical and degenerative changes in the lower lumbar spine associated with a convex right scoliosis. Left wrist and hand: The bones appear mildly demineralized. No evidence of acute fracture or dislocation. The joint spaces are preserved. No evidence of foreign  body or soft tissue emphysema. Right lower leg: The bones appear mildly demineralized. No evidence of acute fracture or dislocation. There are mild tricompartmental degenerative changes at the right knee. Probable superficial laceration along the anteromedial aspect of the proximal tibia with underlying soft tissue emphysema. No foreign body demonstrated. There is mild nonspecific generalized soft tissue edema. IMPRESSION: 1. No evidence of acute fracture or dislocation in the pelvis, right lower leg, left wrist or left hand. 2. Probable superficial laceration along the anteromedial aspect of the proximal right tibia without evidence of foreign body. Electronically Signed   By: Carey Bullocks M.D.   On: 07/11/2023 17:57   DG Tibia/Fibula Right  Result Date: 07/11/2023 CLINICAL DATA:  Fall prior to admission. Abrasions and lacerations of the right lower and left upper extremities. EXAM: LEFT WRIST - COMPLETE 3+ VIEW; RIGHT TIBIA AND FIBULA - 2 VIEW; PELVIS - 1-2 VIEW; LEFT HAND - COMPLETE 3+ VIEW COMPARISON:  Limited correlation made with lumbar spine radiographs 07/03/2021 and CT of the lumbar spine 07/11/2023. FINDINGS: AP pelvis: No evidence of acute fracture,  dislocation or femoral head osteonecrosis. The hip joint spaces are preserved. There are postsurgical and degenerative changes in the lower lumbar spine associated with a convex right scoliosis. Left wrist and hand: The bones appear mildly demineralized. No evidence of acute fracture or dislocation. The joint spaces are preserved. No evidence of foreign body or soft tissue emphysema. Right lower leg: The bones appear mildly demineralized. No evidence of acute fracture or dislocation. There are mild tricompartmental degenerative changes at the right knee. Probable superficial laceration along the anteromedial aspect of the proximal tibia with underlying soft tissue emphysema. No foreign body demonstrated. There is mild nonspecific generalized soft tissue  edema. IMPRESSION: 1. No evidence of acute fracture or dislocation in the pelvis, right lower leg, left wrist or left hand. 2. Probable superficial laceration along the anteromedial aspect of the proximal right tibia without evidence of foreign body. Electronically Signed   By: Carey Bullocks M.D.   On: 07/11/2023 17:57   DG Pelvis 1-2 Views  Result Date: 07/11/2023 CLINICAL DATA:  Fall prior to admission. Abrasions and lacerations of the right lower and left upper extremities. EXAM: LEFT WRIST - COMPLETE 3+ VIEW; RIGHT TIBIA AND FIBULA - 2 VIEW; PELVIS - 1-2 VIEW; LEFT HAND - COMPLETE 3+ VIEW COMPARISON:  Limited correlation made with lumbar spine radiographs 07/03/2021 and CT of the lumbar spine 07/11/2023. FINDINGS: AP pelvis: No evidence of acute fracture, dislocation or femoral head osteonecrosis. The hip joint spaces are preserved. There are postsurgical and degenerative changes in the lower lumbar spine associated with a convex right scoliosis. Left wrist and hand: The bones appear mildly demineralized. No evidence of acute fracture or dislocation. The joint spaces are preserved. No evidence of foreign body or soft tissue emphysema. Right lower leg: The bones appear mildly demineralized. No evidence of acute fracture or dislocation. There are mild tricompartmental degenerative changes at the right knee. Probable superficial laceration along the anteromedial aspect of the proximal tibia with underlying soft tissue emphysema. No foreign body demonstrated. There is mild nonspecific generalized soft tissue edema. IMPRESSION: 1. No evidence of acute fracture or dislocation in the pelvis, right lower leg, left wrist or left hand. 2. Probable superficial laceration along the anteromedial aspect of the proximal right tibia without evidence of foreign body. Electronically Signed   By: Carey Bullocks M.D.   On: 07/11/2023 17:57   CT Lumbar Spine Wo Contrast  Result Date: 07/11/2023 CLINICAL DATA:  Larey Seat with  back pain EXAM: CT LUMBAR SPINE WITHOUT CONTRAST TECHNIQUE: Multidetector CT imaging of the lumbar spine was performed without intravenous contrast administration. Multiplanar CT image reconstructions were also generated. RADIATION DOSE REDUCTION: This exam was performed according to the departmental dose-optimization program which includes automated exposure control, adjustment of the mA and/or kV according to patient size and/or use of iterative reconstruction technique. COMPARISON:  Radiography 07/03/2021 FINDINGS: Segmentation: 5 lumbar type vertebral bodies. Alignment: Scoliotic curvature convex to the right with the apex at L2-3. Vertebrae: No regional fracture. Solid union at the prior surgical level of L4-5 without hardware complication. Paraspinal and other soft tissues: Aortic atherosclerosis. Otherwise negative. Disc levels: T12-L1: Normal L1-2: Leftward translation of L1 relative to L2 of 1 cm. Disc degeneration with vacuum phenomenon and bulging of the disc. Facet degeneration and hypertrophy. Mild stenosis of the left lateral recess and intervertebral foramen on the left. L2-3: Disc degeneration with loss of disc height. Solid bridging left lateral osteophytes. Mild facet hypertrophy. Mild stenosis of the left lateral recess and intervertebral foramen  on the left. L3-4: Rightward translation of L3 relative to L4 1 cm. Advanced disc degeneration with complete loss of disc height and vacuum phenomenon. There is severe stenosis at this level because of canal encroachment by endplate osteophytes and bilateral facet arthropathy. This is worse on the right than on the left but could be symptomatic on either or both sides. L4-5: Distant posterior decompression, diskectomy and fusion. Solid union with sufficient patency of the canal and foramina. L5-S1: Disc degeneration with vacuum phenomenon. Shallow broad-based herniation of the disc more prominent in the left posterolateral direction. Facet degeneration  and hypertrophy. Foraminal stenosis on the right that could affect the exiting L5 nerve. Subarticular lateral recess narrowing on the left that could affect the left S1 nerve. 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 IMPRESSION: 1. No acute or traumatic finding. Scoliotic curvature convex to the right with the apex at L2-3. 2. L1-2: Leftward translation of L1 relative to L2 of 1 cm. Disc degeneration with vacuum phenomenon and bulging of the disc. Mild stenosis of the left lateral recess and intervertebral foramen on the left. 3. L2-3: Solid bridging left lateral osteophytes. Mild stenosis of the left lateral recess and intervertebral foramen on the left. 4. L3-4: Rightward translation of L3 relative to L4 of 1 cm. Advanced disc degeneration with complete loss of disc height and vacuum phenomenon. Severe stenosis at this level because of canal encroachment by endplate osteophytes and bilateral facet arthropathy. This is worse on the right than on the left but could be symptomatic on either or both sides. 5. L4-5: Distant posterior decompression, diskectomy and fusion. Solid union with sufficient patency of the canal and foramina. 6. L5-S1: Disc degeneration with vacuum phenomenon. Shallow broad-based herniation of the disc more prominent in the left posterolateral direction. Facet degeneration and hypertrophy. Foraminal stenosis on the right that could affect the exiting L5 nerve. Subarticular lateral recess narrowing on the left that could affect the left S1 nerve. Aortic Atherosclerosis (ICD10-I70.0). Electronically Signed   By: Paulina Fusi M.D.   On: 07/11/2023 17:44   CT Head Wo Contrast  Result Date: 07/11/2023 CLINICAL DATA:  Trauma.  Fall. EXAM: CT HEAD WITHOUT CONTRAST CT CERVICAL SPINE WITHOUT CONTRAST TECHNIQUE: Multidetector CT imaging of the head and cervical spine was performed following the standard protocol without intravenous contrast. Multiplanar CT image reconstructions of the cervical  spine were also generated. RADIATION DOSE REDUCTION: This exam was performed according to the departmental dose-optimization program which includes automated exposure control, adjustment of the mA and/or kV according to patient size and/or use of iterative reconstruction technique. COMPARISON:  None Available. FINDINGS: CT HEAD FINDINGS Brain: Mild age-related atrophy and chronic microvascular ischemic changes. There is no acute intracranial hemorrhage. No mass effect or midline shift. No extra-axial fluid collection. Vascular: No hyperdense vessel or unexpected calcification. Skull: Normal. Negative for fracture or focal lesion. Sinuses/Orbits: No acute finding. Other: None CT CERVICAL SPINE FINDINGS Alignment: No acute subluxation. Skull base and vertebrae: No acute fracture.  Osteopenia. Soft tissues and spinal canal: No prevertebral fluid or swelling. No visible canal hematoma. Disc levels:  No acute findings.  Degenerative changes. Upper chest: Negative. Other: Bilateral carotid bulb calcified plaques. IMPRESSION: 1. No acute intracranial pathology. Mild age-related atrophy and chronic microvascular ischemic changes. 2. No acute/traumatic cervical spine pathology. Electronically Signed   By: Elgie Collard M.D.   On: 07/11/2023 17:41   CT Cervical Spine Wo Contrast  Result Date: 07/11/2023 CLINICAL DATA:  Trauma.  Fall. EXAM: CT HEAD  WITHOUT CONTRAST CT CERVICAL SPINE WITHOUT CONTRAST TECHNIQUE: Multidetector CT imaging of the head and cervical spine was performed following the standard protocol without intravenous contrast. Multiplanar CT image reconstructions of the cervical spine were also generated. RADIATION DOSE REDUCTION: This exam was performed according to the departmental dose-optimization program which includes automated exposure control, adjustment of the mA and/or kV according to patient size and/or use of iterative reconstruction technique. COMPARISON:  None Available. FINDINGS: CT HEAD  FINDINGS Brain: Mild age-related atrophy and chronic microvascular ischemic changes. There is no acute intracranial hemorrhage. No mass effect or midline shift. No extra-axial fluid collection. Vascular: No hyperdense vessel or unexpected calcification. Skull: Normal. Negative for fracture or focal lesion. Sinuses/Orbits: No acute finding. Other: None CT CERVICAL SPINE FINDINGS Alignment: No acute subluxation. Skull base and vertebrae: No acute fracture.  Osteopenia. Soft tissues and spinal canal: No prevertebral fluid or swelling. No visible canal hematoma. Disc levels:  No acute findings.  Degenerative changes. Upper chest: Negative. Other: Bilateral carotid bulb calcified plaques. IMPRESSION: 1. No acute intracranial pathology. Mild age-related atrophy and chronic microvascular ischemic changes. 2. No acute/traumatic cervical spine pathology. Electronically Signed   By: Elgie Collard M.D.   On: 07/11/2023 17:41    Procedures Procedures  {Document cardiac monitor, telemetry assessment procedure when appropriate:1}  Medications Ordered in ED Medications  Tdap (BOOSTRIX) injection 0.5 mL (0.5 mLs Intramuscular Given 07/11/23 1658)    ED Course/ Medical Decision Making/ A&P   {   Click here for ABCD2, HEART and other calculatorsREFRESH Note before signing :1}                              Medical Decision Making Amount and/or Complexity of Data Reviewed Radiology: ordered.  Risk Prescription drug management.   ***  {Document critical care time when appropriate:1} {Document review of labs and clinical decision tools ie heart score, Chads2Vasc2 etc:1}  {Document your independent review of radiology images, and any outside records:1} {Document your discussion with family members, caretakers, and with consultants:1} {Document social determinants of health affecting pt's care:1} {Document your decision making why or why not admission, treatments were needed:1} Final Clinical Impression(s)  / ED Diagnoses Final diagnoses:  None    Rx / DC Orders ED Discharge Orders     None

## 2023-08-13 ENCOUNTER — Ambulatory Visit (INDEPENDENT_AMBULATORY_CARE_PROVIDER_SITE_OTHER): Payer: Medicare Other | Admitting: Infectious Disease

## 2023-08-13 ENCOUNTER — Telehealth: Payer: Self-pay

## 2023-08-13 ENCOUNTER — Other Ambulatory Visit (HOSPITAL_COMMUNITY): Payer: Self-pay

## 2023-08-13 ENCOUNTER — Encounter: Payer: Self-pay | Admitting: Infectious Disease

## 2023-08-13 ENCOUNTER — Other Ambulatory Visit: Payer: Self-pay

## 2023-08-13 VITALS — BP 161/90 | HR 89 | Temp 98.3°F | Resp 16 | Wt 145.4 lb

## 2023-08-13 DIAGNOSIS — E785 Hyperlipidemia, unspecified: Secondary | ICD-10-CM | POA: Diagnosis not present

## 2023-08-13 DIAGNOSIS — L270 Generalized skin eruption due to drugs and medicaments taken internally: Secondary | ICD-10-CM | POA: Diagnosis not present

## 2023-08-13 DIAGNOSIS — C44722 Squamous cell carcinoma of skin of right lower limb, including hip: Secondary | ICD-10-CM

## 2023-08-13 DIAGNOSIS — R9431 Abnormal electrocardiogram [ECG] [EKG]: Secondary | ICD-10-CM

## 2023-08-13 DIAGNOSIS — A318 Other mycobacterial infections: Secondary | ICD-10-CM

## 2023-08-13 DIAGNOSIS — M069 Rheumatoid arthritis, unspecified: Secondary | ICD-10-CM

## 2023-08-13 MED ORDER — LINEZOLID 600 MG PO TABS: 600.0000 mg | ORAL_TABLET | Freq: Every day | ORAL | 6 refills | Status: DC

## 2023-08-13 MED ORDER — AZITHROMYCIN 250 MG PO TABS: 250.0000 mg | ORAL_TABLET | Freq: Every day | ORAL | 6 refills | Status: DC

## 2023-08-13 NOTE — Progress Notes (Signed)
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   MRN: [id number]  HPI  Allison Randall is a 72 year-old lady who has a past medical history significant for rheumatoid arthritis, hyperlipidemia HSV infection, who had been getting regular treatment with saline injections to treat varicose veins who developed mycobacterium chelonae infection  Interim history:    The past these had gone without complication but however this past February she says that the typical erythematous areas in her legs that ensue after injection of the saline failed to resolve and several areas on her upper legs in particular became more erythematous and more inflamed.  She was given several rounds of antibiotics and seen by dermatology who ultimately performed a biopsy which came back with granulomatous pathology as well as evidence of HSV faction with inclusion bodies present and stains positive.  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 Emily Filbert.  M chelonae I susceptibilities are shown before and pictured.    Interim history:  The only reasonable options I saw at that time for her would be oral azithromycin 500 mg along with clofazimine 2 tablets once daily.  She started clofazimine and azithromycin.  She seem to be doing pretty well.  Then in June 2022 she went to a very loud country music concert and afterwards noticed that she had loss of hearing and also tinnitus this persisted and continues to the present day and she is visibly hard of hearing in the clinic.  She had looked up possibility of azithromycin causing hearing loss and while rare it can indeed do so.  I had heard of this I had counseled her to stop the antibiotics but she wanted to continue at the time since there were not many antibiotic  options.  UNFortunately as predicted off antibiotics her Mycobacterium had begun to cause her problems again and lesions have grown and one has become purulent. She is for cataract surgery in October as well as come concerned about the fact that she still has soft tissue infection.  I assured her this should not affect her cataract surgery.  We were able to get her an infusion of IV Nuzyra, and now her Medicare drug plan will cover [name] though it is still leaving her with $3300 plus co-pay for the first 30 days of tablets.  We were able to get 15 days of tablets from the drug company an additional 15 days which we have given her.  Another option were contemplating was therapy with clofazimine and bedalaquine but I worried about QT prolonging effects of both the clofazamine and bedalaquine.  She also has been on Zofran and had nausea.  We obtained a twelve-lead EKG at last visit which showed a QT that was prolonged to 422 ms and a QTC of 462 ms with normal sinus rhythm and some preventricular ventricular contractions  Retruned  to clinic for follow-up ==and we obtained a repeat EKG which now shows a QT of 387 and a QTC of 426 ms with normal sinus rhythm.  She continued of improvement in the areas in her legs.  Her hearing loss completely returned to normal.  She then  developed several areas of hyperpigmentation in her legs including the calves thighs arms and upper chest.  She also has dry skin and intense  pruritus.  Upon examining them in   at prior visit they seem consistent with a tetracycline induced hyperpigmentation rash.  In March  wwe stopped her antimycobacterial therapy and have been observing her off therapy.   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  She continues to be followed with dermatology.  She then developed new lesions on her left lower leg, left upper thigh with largest and most tender lesion and lesion in that  thigh but also a lesion in her right arm.  Biopsies were performed and pathology from the left lower extremity revealed some mycobacteria within a granulomatous pathology.  The cultures apparently that were taken did not arrive at proper temperature for the organism to be cultured and so no AFB culture sensitivity was obtained there.  The patient tells me that her right upper arm lesion was biopsied roughly a week prior to her visit with me in May.  Those cultures have indeed yielded Mycobacterium chelonae and sensitivity data is still pending with the lab have been sent to [place].    She has stopped her MTX.  I have referred her to [name], MD at [facility] she has an appoint with him coming up at the end of July.  Interestingly since I last saw her several of these lesions have improved in their appearance despite her not having received therapy that would be directed against mycobacterial infection.  Is a new area on her arm where the biopsy was taken that is become more hyperpigmented    She saw [name] on 05/11/2023. He was concerned that the patient could have disseminated disease, given lesions in multiple locations including now her arms.  He felt first step was to get a  AFB blood culture to determine if she in fact has disseminated infection.  He also ebiopsied the leg so we can get an isolate on hand and send it for susceptibility testing to [place]. He was not optimistic about the prospects for phage therapy here but an isolate would be necessary if this were to be pursued.  He recommended in the interim applying heat therapy and he has apparently cured patients with heat therapy alone. He advised Ms. Allison Randall to apply hand warmers to the affected sites for about an hour twice daily. I  In addition to that he recommended topical i imiquimod therapy. He  recommended applying imiquimod cream to each affected area  daily 5 days per week (Monday-Friday).   He recommended  starting with  an 8-week course and reassess. He also favored holding the methotrexate.   Her rheumatologist has given her a prescription for hydroxychloroquine and using that instead is fine by me; there are some QT issues that may complicate future antibiotic treatment but that can be managed with close monitoring.  Susceptibility testing from Dr Dellia Nims office showed usceptible to clarithromycin (MIC 0.25), tobramycin, and linezolid; I to amikacin and doxycycline, R to cefoxitin, ciprofloxacin, moxifloxacin, and imipenem. Favorable MICs to clofazimine (0.25) and tigecycline (0.12). He recommended that if the heat therapy/imiquimod therapy did not work that a 2 drug regimen of zithromycin 250 mg daily plus linezolid 600 mg daily would be a reaonable approach.    [facility] lab running of her last visit in August and there has been no growth to date which 1 would expect to have already happened given that M chelonae is a rapid grower.  When applying the heat therapy twice a day when she can  do sometimes it is only once a day.  There had been some slight improvement in some of the lesions at last visit".  Since then she has been treated by a provider at [facility] for cellulitis several times with doxycycline with improvement of this area on RLE  FORTUNATELY her blood culture at [facility] was negative and final for AFB.  Her soft tissue culture WAS + for M chelonae with sensitivities as below:    Topical Aldara and heat therapy has not improved her lesions and they have actually progressed.  Will therefore initiate therapy today.      Past Medical History:  Diagnosis Date   Arthritis    CAP (community acquired pneumonia) 02/13/2022   Cataract 06/18/2021   Drug rash 06/08/2023   Hearing loss 05/14/2021   High triglycerides    HSV infection 03/25/2021   Hypertension    Hypothyroidism    Mycobacterium chelonae infection 03/25/2021   Nausea 05/14/2021   QT prolongation 08/02/2021    Rheumatoid arthritis (HCC)    Rheumatoid arthritis (HCC) 03/25/2021   Squamous cell carcinoma, leg 02/13/2022   Tinnitus 05/14/2021    Past Surgical History:  Procedure Laterality Date   BREAST BIOPSY Right 10/22/2010   CHOLECYSTECTOMY     eyelid lift     teeth implants     bottom, with screws    No family history on file.    Social History   Socioeconomic History   Marital status: Married    Spouse name: Not on file   Number of children: Not on file   Years of education: Not on file   Highest education level: Not on file  Occupational History   Not on file  Tobacco Use   Smoking status: Former    Current packs/day: 0.00    Types: Cigarettes    Quit date: 11/30/2007    Years since quitting: 15.7   Smokeless tobacco: Never  Substance and Sexual Activity   Alcohol use: No   Drug use: No   Sexual activity: Not on file  Other Topics Concern   Not on file  Social History Narrative   Not on file   Social Determinants of Health   Financial Resource Strain: Low Risk  (07/29/2022)   [facility] from [facility] prior to 12/13/2022[facility] visits prior to 12/13/2022.   Overall Financial Resource Strain (CARDIA)    Difficulty of Paying Living Expenses: Not hard at all  Food Insecurity: Low Risk  (07/30/2023)   Received from [facility]   Hunger Vital Sign    Worried About Running Out of Food in the Last Year: Never true    Ran Out of Food in the Last Year: Never true  Transportation Needs: No Transportation Needs (07/30/2023)   Received from [facility]    In the past 12 months, has lack of reliable transportation kept you from medical appointments, meetings, work or from getting things needed for daily living? : No  Physical Activity: Insufficiently Active (07/29/2022)   Received from [facility] visits prior to 12/13/2022., [facility] visits prior to 12/13/2022.   Exercise Vital Sign    Days of Exercise per Week: 2 days    Minutes of Exercise per Session: 60 min  Stress: No Stress Concern Present (07/29/2022)   [facility] visits prior to 12/13/2022., [facility] visits prior to 12/13/2022.   [employer] of Occupational Health - Occupational Stress Questionnaire    Feeling of Stress : Not at all  Social Connections: Moderately Integrated (07/29/2022)   [facility] from [facility] visits prior to 12/13/2022., [facility] visits prior to 12/13/2022.   Social Connection and Isolation Panel [NHANES]    Frequency of Communication with Friends and Family: Three times a week    Frequency of Social Gatherings with Friends and Family: Once a week    Attends Religious Services: 1 to 4 times per year    Active Member of [employer] or Organizations: No    Attends Engineer, structural: Patient declined    Marital Status: Married    Allergies  Allergen Reactions   Azithromycin Tinitus    Hearing loss and tinnitus   Augmentin [Amoxicillin-Pot Clavulanate] Cough    Redness on face chest and arms    No Known Allergies      Current Outpatient Medications:    atorvastatin (LIPITOR) 20 MG tablet, Take 20 mg by mouth daily at 6 PM., Disp: , Rfl:    Calcium-Vitamin D-Vitamin K 500-100-40 MG-UNT-MCG CHEW, Chew 2 tablets by mouth daily., Disp: , Rfl:    cholecalciferol (VITAMIN D) 1000 UNITS tablet, Take 1,000 Units by mouth daily., Disp: , Rfl:    cycloSPORINE (RESTASIS) 0.05 % ophthalmic emulsion, Place 1 drop into both eyes 2 (two) times daily., Disp: , Rfl:    doxycycline (VIBRAMYCIN) 100 MG capsule, Take 1 capsule (100 mg total) by mouth 2 (two) times daily., Disp: 20 capsule, Rfl: 0   fexofenadine-pseudoephedrine (ALLEGRA-D 24)  180-240 MG 24 hr tablet, Take 1 tablet by mouth daily., Disp: , Rfl:    folic acid (FOLVITE) 1 MG tablet, Take 1 mg by mouth 2 (two) times daily. , Disp: , Rfl:    imiquimod (ALDARA) 5 % cream, Apply to affected areas 5 days every week x 60 days, Disp: 48 each, Rfl: 0   Krill Oil Omega-3 300 MG CAPS, Take 1 capsule by mouth daily., Disp: , Rfl:    levothyroxine (SYNTHROID, LEVOTHROID) 112 MCG tablet, Take 112 mcg by mouth daily before breakfast., Disp: , Rfl:    losartan-hydrochlorothiazide (HYZAAR) 100-12.5 MG per tablet, Take 1 tablet by mouth daily., Disp: , Rfl:    Lysine 1000 MG TABS, Take 1,000 mg by mouth 2 (two) times daily., Disp: , Rfl:    methocarbamol (ROBAXIN) 500 MG tablet, Take 1 tablet (500 mg total) by mouth every 6 (six) hours as needed for muscle spasms., Disp: 60 tablet, Rfl: 0   Multiple Vitamin (MULITIVITAMIN WITH MINERALS) TABS, Take 1 tablet by mouth daily., Disp: , Rfl:    Probiotic Product (PROBIOTIC PO), Take 1 tablet by mouth daily., Disp: , Rfl:    Pseudoephedrine HCl (SUDAFED 24 HOUR PO), Take 1 tablet by mouth daily as needed (sinus pressure)., Disp: , Rfl:    valACYclovir (VALTREX) 500 MG tablet, Take 2,000 mg by mouth 2 (two) times daily as needed (cold sores). , Disp: , Rfl:    zolpidem (AMBIEN) 10 MG tablet, Take 2.5 mg by mouth at bedtime as needed for sleep., Disp: , Rfl:    Review of Systems  Constitutional:  Negative for activity change, appetite change, chills, diaphoresis, fatigue, fever and unexpected weight change.  HENT:  Negative for congestion, rhinorrhea, sinus pressure, sneezing, sore throat and trouble swallowing.  Eyes:  Negative for photophobia and visual disturbance.  Respiratory:  Negative for cough, chest tightness, shortness of breath, wheezing and stridor.   Cardiovascular:  Negative for chest pain, palpitations and leg swelling.  Gastrointestinal:  Negative for abdominal distention, abdominal pain, anal bleeding, blood in stool,  constipation, diarrhea, nausea and vomiting.  Genitourinary:  Negative for difficulty urinating, dysuria, flank pain and hematuria.  Musculoskeletal:  Positive for arthralgias. Negative for back pain, gait problem, joint swelling and myalgias.  Skin:  Positive for color change and rash. Negative for pallor and wound.  Neurological:  Negative for dizziness, tremors, weakness and light-headedness.  Hematological:  Negative for adenopathy. Does not bruise/bleed easily.  Psychiatric/Behavioral:  Negative for agitation, behavioral problems, confusion, decreased concentration, dysphoric mood and sleep disturbance.        Objective:   Physical Exam Exam conducted with a chaperone present.  Constitutional:      General: She is not in acute distress.    Appearance: Normal appearance. She is well-developed. She is not ill-appearing or diaphoretic.  HENT:     Head: Normocephalic and atraumatic.     Right Ear: Hearing and external ear normal.     Left Ear: Hearing and external ear normal.     Nose: No nasal deformity or rhinorrhea.  Eyes:     General: No scleral icterus.    Conjunctiva/sclera: Conjunctivae normal.     Right eye: Right conjunctiva is not injected.     Left eye: Left conjunctiva is not injected.     Pupils: Pupils are equal, round, and reactive to light.  Neck:     Vascular: No JVD.  Cardiovascular:     Rate and Rhythm: Normal rate and regular rhythm.     Heart sounds: S1 normal and S2 normal.  Pulmonary:     Effort: Pulmonary effort is normal. No respiratory distress.     Breath sounds: No wheezing.  Abdominal:     General: Bowel sounds are normal. There is no distension.     Palpations: Abdomen is soft.     Tenderness: There is no abdominal tenderness.  Musculoskeletal:        General: Normal range of motion.     Right shoulder: Normal.     Left shoulder: Normal.     Cervical back: Normal range of motion and neck supple.     Right hip: Normal.     Left hip: Normal.      Right knee: Normal.     Left knee: Normal.  Lymphadenopathy:     Head:     Right side of head: No submandibular, preauricular or posterior auricular adenopathy.     Left side of head: No submandibular, preauricular or posterior auricular adenopathy.     Cervical: No cervical adenopathy.     Right cervical: No superficial or deep cervical adenopathy.    Left cervical: No superficial or deep cervical adenopathy.  Skin:    General: Skin is warm and dry.     Coloration: Skin is not pale.     Findings: No abrasion, bruising, ecchymosis, erythema, lesion or rash.     Nails: There is no clubbing.  Neurological:     Mental Status: She is alert and oriented to person, place, and time.     Sensory: No sensory deficit.     Coordination: Coordination normal.     Gait: Gait normal.  Psychiatric:        Attention and Perception: She is attentive.  Mood and Affect: Mood normal.        Speech: Speech normal.        Behavior: Behavior normal. Behavior is cooperative.        Thought Content: Thought content normal.        Judgment: Judgment normal.       07/18/2021:            08/02/2021:  Right     Left leg     08/13/2023 Right leg      08/13/2023 Right leg   Left leg     Left leg near thigh,inguinal area     Right arm     Mycobacterium chelonae soft tissue infection  Initiate lower dose azithromycin 250 mg daily with 600 mg of Zyvox daily.  I will have her return to clinic in early December to reassess her and check lab work.  Note her organism was also sensitive to clofazimine and also Bactrim in addition to omadacycline and tigecycline  Hearing loss resolved hopefully with rechallenge low doses of the myosin we do not see the same issue emerge  Arthritis she is currently off the hydroxychloroquine due to concerns about QT prolongation I did a repeat EKG today which showed improvement in her QT interval with was now 365 and QTc of  412.  I would like to see how her QT does on azithromycin by itself but perhaps if that is still doing well then the hydroxychloroquine could be added back by rheumatology with close monitoring.  I have personally spent44  minutes involved in face-to-face and non-face-to-face activities for this patient on the day of the visit. Professional time spent includes the following activities: Preparing to see the patient (review of tests), Obtaining and/or reviewing separately obtained history (admission/discharge record), Performing a medically appropriate examination and/or evaluation , Ordering medications/tests/procedures, referring and communicating with other health care professionals, Documenting clinical information in the EMR, Independently interpreting results (not separately reported), Communicating results to the patient/family/caregiver, Counseling and educating the patient/family/caregiver and Care coordination (not separately reported).

## 2023-08-13 NOTE — Telephone Encounter (Addendum)
Pharmacy Patient Advocate Encounter   Received notification from CoverMyMeds that prior authorization for NUZYRA is required/requested.   Insurance verification completed.   The patient is insured through  [facility])  .   Per test claim: PA required; PA submitted to above mentioned insurance via CoverMyMeds Key/confirmation #/EOC  [id number] Status is pending   Pharmacy Patient Advocate Encounter  Received notification from  [facility])  that Prior Authorization for NUZYRA 150 TAB has been APPROVED from 08/13/23 to 10/12/24   PA #/Case ID/Reference #:  [id number]  6TABS 3DAY SUPPLY- $782.09 30TABS 15DAY SUPPLY- $3,021.27 60TABS 30DAY SUPPLY-  $7,829.56

## 2023-08-24 ENCOUNTER — Encounter: Payer: Self-pay | Admitting: Infectious Disease

## 2023-08-26 NOTE — Progress Notes (Unsigned)
08/27/2023  HPI: Allison Randall is a 72 y.o. female who presents to the [facility] clinic today to follow up for Mycobacterium chelonae infection.  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   HSV infection 03/25/2021   Rheumatoid arthritis (HCC) 03/25/2021   Menopause present 03/25/2021   Frequent nosebleeds 03/16/2021   Sun-damaged skin 03/16/2021   Neck pain 11/29/2020   Lumbar stenosis with neurogenic claudication 11/21/2016   Acquired hypothyroidism 09/28/2016   Chronic sciatica 09/22/2016   Essential hypertension 09/22/2016   Hyperlipidemia 09/22/2016   Microscopic hematuria 09/22/2016   Osteopenia 09/22/2016   Situational insomnia 09/22/2016   Vertigo 09/22/2016    Patient's Medications  New Prescriptions   No medications on file  Previous Medications   ATORVASTATIN (LIPITOR) 20 MG TABLET    Take 20 mg by mouth daily at 6 PM.   AZITHROMYCIN (ZITHROMAX) 250 MG TABLET    Take 1 tablet (250 mg total) by mouth daily.   CALCIUM-VITAMIN D-VITAMIN K 500-100-40 MG-UNT-MCG CHEW    Chew 2 tablets by mouth daily.   CHOLECALCIFEROL (VITAMIN D) 1000 UNITS TABLET    Take 1,000 Units by mouth daily.   CYCLOSPORINE (RESTASIS) 0.05 % OPHTHALMIC EMULSION    Place 1 drop into both eyes 2 (two) times daily.   FOLIC ACID (FOLVITE) 1 MG TABLET    Take 1 mg by mouth 2 (two) times daily.    IMIQUIMOD (ALDARA) 5 % CREAM    Apply to affected areas 5 days every week x 60 days   KRILL OIL OMEGA-3 300 MG CAPS    Take 1 capsule by mouth daily.   LEVOTHYROXINE (SYNTHROID, LEVOTHROID) 112 MCG TABLET    Take 112 mcg by mouth daily before breakfast.   LINEZOLID (ZYVOX) 600 MG TABLET    Take 1 tablet (600 mg total) by  mouth daily.   LOSARTAN-HYDROCHLOROTHIAZIDE (HYZAAR) 100-12.5 MG PER TABLET    Take 1 tablet by mouth daily.   LYSINE 1000 MG TABS    Take 1,000 mg by mouth 2 (two) times daily.   METHOCARBAMOL (ROBAXIN) 500 MG TABLET    Take 1 tablet (500 mg total) by mouth every 6 (six) hours as needed for muscle spasms.   MULTIPLE VITAMIN (MULITIVITAMIN WITH MINERALS) TABS    Take 1 tablet by mouth daily.   PROBIOTIC PRODUCT (PROBIOTIC PO)    Take 1 tablet by mouth daily.   VALACYCLOVIR (VALTREX) 500 MG TABLET    Take 2,000 mg by mouth 2 (two) times daily as needed (cold sores).    ZOLPIDEM (AMBIEN) 10 MG TABLET    Take 2.5 mg by mouth at bedtime as needed for sleep.  Modified Medications   No medications on file  Discontinued Medications   No medications on file    Assessment: Merline presents today to discuss Clofazimine initiation today. She is taking for NTM lesions. She was previously taking Azithromycin 250 daily + Zyvox 600 mg daily + imiquimod therapy. Unfortunately, she experienced some worsening tinnitus. Now transitioning to Clofazimine and Zyvox. Clofazimine is 2 capsules (100 mg) once daily. Administer with meals, and swallow the capsule whole. Adverse effects to look out for include skin discoloration (orange-pink to brownish-black), skin thickening/scaliness, and  dry skin. May also experience GI side effects, including pain, nausea, and diarrhea. All questions were answered  Plan: - Start Clofazimine, continue Linezolid and Imiquimod topical - Call with any questions or concerns - Next visit 09/16/23 with Dr. Daiva Eves.  Lora Paula, PharmD PGY-2 Infectious Diseases Pharmacy Resident [facility] 08/27/2023 12:09 PM

## 2023-08-27 ENCOUNTER — Other Ambulatory Visit: Payer: Self-pay

## 2023-08-27 ENCOUNTER — Ambulatory Visit: Payer: Medicare Other | Admitting: Pharmacist

## 2023-08-27 DIAGNOSIS — A309 Leprosy, unspecified: Secondary | ICD-10-CM

## 2023-08-27 DIAGNOSIS — A318 Other mycobacterial infections: Secondary | ICD-10-CM

## 2023-08-27 MED ORDER — CLOFAZIMINE 50 MG PO CAPS - FOR COMPASSIONATE USE: 100.0000 mg | ORAL_CAPSULE | Freq: Every day | ORAL | 0 refills | Status: AC

## 2023-08-27 NOTE — Progress Notes (Signed)
HPI: Allison Randall is a 72 y.o. female who presents to the [facility] pharmacy clinic for clofazimine initiation.   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   HSV infection 03/25/2021   Rheumatoid arthritis (HCC) 03/25/2021   Menopause present 03/25/2021   Frequent nosebleeds 03/16/2021   Sun-damaged skin 03/16/2021   Neck pain 11/29/2020   Lumbar stenosis with neurogenic claudication 11/21/2016   Acquired hypothyroidism 09/28/2016   Chronic sciatica 09/22/2016   Essential hypertension 09/22/2016   Hyperlipidemia 09/22/2016   Microscopic hematuria 09/22/2016   Osteopenia 09/22/2016   Situational insomnia 09/22/2016   Vertigo 09/22/2016    Patient's Medications  New Prescriptions   CLOFAZIMINE 50 MG CAPS CAPSULE (FOR COMPASSIONATE USE)    Take 2 capsules (100 mg total) by mouth daily with breakfast.  Previous Medications   ATORVASTATIN (LIPITOR) 20 MG TABLET    Take 20 mg by mouth daily at 6 PM.   CALCIUM-VITAMIN D-VITAMIN K 500-100-40 MG-UNT-MCG CHEW    Chew 2 tablets by mouth daily.   CHOLECALCIFEROL (VITAMIN D) 1000 UNITS TABLET    Take 1,000 Units by mouth daily.   CYCLOSPORINE (RESTASIS) 0.05 % OPHTHALMIC EMULSION    Place 1 drop into both eyes 2 (two) times daily.   FOLIC ACID (FOLVITE) 1 MG TABLET    Take 1 mg by mouth 2 (two) times daily.    IMIQUIMOD (ALDARA) 5 % CREAM    Apply to affected areas 5 days every week x 60 days   KRILL OIL OMEGA-3 300 MG CAPS    Take 1 capsule by mouth daily.   LEVOTHYROXINE (SYNTHROID, LEVOTHROID) 112 MCG TABLET    Take 112 mcg by mouth daily before breakfast.   LINEZOLID (ZYVOX) 600 MG TABLET    Take 1 tablet (600 mg total) by mouth daily.    LOSARTAN-HYDROCHLOROTHIAZIDE (HYZAAR) 100-12.5 MG PER TABLET    Take 1 tablet by mouth daily.   LYSINE 1000 MG TABS    Take 1,000 mg by mouth 2 (two) times daily.   METHOCARBAMOL (ROBAXIN) 500 MG TABLET    Take 1 tablet (500 mg total) by mouth every 6 (six) hours as needed for muscle spasms.   MULTIPLE VITAMIN (MULITIVITAMIN WITH MINERALS) TABS    Take 1 tablet by mouth daily.   PROBIOTIC PRODUCT (PROBIOTIC PO)    Take 1 tablet by mouth daily.   VALACYCLOVIR (VALTREX) 500 MG TABLET    Take 2,000 mg by mouth 2 (two) times daily as needed (cold sores).    ZOLPIDEM (AMBIEN) 10 MG TABLET    Take 2.5 mg by mouth at bedtime as needed for sleep.  Modified Medications   No medications on file  Discontinued Medications   AZITHROMYCIN (ZITHROMAX) 250 MG TABLET    Take 1 tablet (250 mg total) by mouth daily.    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   Nausea 05/14/2021   QT prolongation 08/02/2021   Rheumatoid arthritis (HCC)    Rheumatoid arthritis (HCC) 03/25/2021   Squamous cell carcinoma, leg 02/13/2022   Tinnitus 05/14/2021    Social History: Social History   Socioeconomic History   Marital status: Married    Spouse name: Not on file   Number of children: Not on file   Years of education: Not on file   Highest education level: Not on file  Occupational History   Not on file  Tobacco Use   Smoking status: Former    Current packs/day: 0.00    Types: Cigarettes    Quit date: 11/30/2007     Years since quitting: 15.7   Smokeless tobacco: Never  Substance and Sexual Activity   Alcohol use: No   Drug use: No   Sexual activity: Not on file  Other Topics Concern   Not on file  Social History Narrative   Not on file   Social Determinants of Health   Financial Resource Strain: Low Risk  (07/29/2022)   [facility] visits prior to 12/13/2022.   Overall Financial Resource Strain (CARDIA)    Difficulty of Paying Living Expenses: Not hard at all  Food Insecurity: Low Risk  (07/30/2023)   [facility]   Hunger Vital Sign    Worried About Running Out of Food in the Last Year: Never true    Ran Out of Food in the Last Year: Never true  Transportation Needs: No Transportation Needs (07/30/2023)   Received from [employer]    In the past 12 months, has lack of reliable transportation kept you from medical appointments, meetings, work or from getting things needed for daily living? : No  Physical Activity: Insufficiently Active (07/29/2022)   [facility] to 12/13/2022[facility] visits prior to 12/13/2022.   Exercise Vital Sign    Days of Exercise per Week: 2 days    Minutes of Exercise per Session: 60 min  Stress: No Stress Concern Present (07/29/2022)   [facility] to 12/13/2022[facility] visits prior to 12/13/2022.   [employer] of Occupational Health - Occupational Stress Questionnaire    Feeling of Stress : Not at all  Social Connections: Moderately Integrated (07/29/2022)   [facility] prior to 12/13/2022[facility] visits  prior to 12/13/2022.   Social Connection and Isolation Panel [NHANES]    Frequency of Communication with Friends and Family: Three times a week    Frequency of Social Gatherings with Friends and Family: Once a week    Attends Religious Services: 1 to 4 times per year    Active Member of [employer] or Organizations: No    Attends Engineer, structural: Patient declined    Marital Status: Married    Labs: No results found for: "HIV1RNAQUANT", "HIV1RNAVL", "CD4TABS"  RPR and STI No results found for: "LABRPR", "RPRTITER"      No data to display  Hepatitis B No results found for: "HEPBSAB", "HEPBSAG", "HEPBCAB" Hepatitis C No results found for: "HEPCAB", "HCVRNAPCRQN" Hepatitis A No results found for: "HAV" Lipids: No results found for: "CHOL", "TRIG", "HDL", "CHOLHDL", "VLDL", "LDLCALC"  Current Regimen: Azithromycin + linezolid  Assessment: Kameren presents to clinic today for clofazimine initiation. Experienced tinnitus after re-trialing azithromycin at a reduced dose of 250mg  as she previously experienced tinnitus with azithromycin 500mg . Will replace azithromycin with clofazimine and continue linezolid dosing. States she is tolerating linezolid well aside from mild diarrhea. States she tolerated clofazimine well when previously taking aside from mild constipation. Her QTc was within appropriate range at 412 on 08/13/2023. All questions answered; counseling points below. Will follow up with Dr. Daiva Eves on 12/4.   Counseled to take TWO clofazimine capsules (100 mg) together once daily with food. Advised not to separate capsules and to make sure to take them together. Encouraged not to miss any doses and to continue taking until discontinued.  Counseled on what to do if dose is missed - if it is closer to the missed dose take immediately; if closer to next dose skip dose and take the next dose at the usual time. Counseled that side effects are usually observed on higher  doses but that common side effects include GI upset with nausea and diarrhea. Counseled that clofazimine should also be taken with a full glass of water. Also counseled that medication can darken skin and other secretions such as tears, saliva, and urine. Advised to avoid direct sun exposure while on clofazimine as the medication can increase sun sensitivity.  Asked that they wear sunscreen, a hat, and long sleeves while outside.  Other common side effects include skin dryness and liver toxicity but advised that we will be monitoring liver function throughout therapy. All side effects tend to resolve after discontinuation of clofazimine. Answered all questions. Patient will call if any issues arise.   Plan: - Stop azithromycin - Start clofazimine - Continue linezolid - Follow up with Dr. Daiva Eves on 09/16/2023  Margarite Gouge, PharmD, CPP, BCIDP, AAHIVP Clinical Pharmacist Practitioner Infectious Diseases Clinical Pharmacist [facility] 08/27/2023, 2:36 PM

## 2023-09-01 ENCOUNTER — Encounter: Payer: Self-pay | Admitting: Infectious Disease

## 2023-09-02 ENCOUNTER — Encounter: Payer: Self-pay | Admitting: Infectious Disease

## 2023-09-07 ENCOUNTER — Telehealth: Payer: Self-pay

## 2023-09-07 ENCOUNTER — Other Ambulatory Visit (HOSPITAL_COMMUNITY): Payer: Self-pay

## 2023-09-07 ENCOUNTER — Encounter: Payer: Self-pay | Admitting: Infectious Disease

## 2023-09-07 ENCOUNTER — Other Ambulatory Visit: Payer: Self-pay

## 2023-09-07 ENCOUNTER — Ambulatory Visit (INDEPENDENT_AMBULATORY_CARE_PROVIDER_SITE_OTHER): Payer: Medicare Other | Admitting: Infectious Disease

## 2023-09-07 VITALS — BP 134/85 | HR 88 | Resp 16 | Ht 64.0 in | Wt 148.4 lb

## 2023-09-07 DIAGNOSIS — A318 Other mycobacterial infections: Secondary | ICD-10-CM | POA: Diagnosis present

## 2023-09-07 DIAGNOSIS — H9313 Tinnitus, bilateral: Secondary | ICD-10-CM

## 2023-09-07 DIAGNOSIS — M057A Rheumatoid arthritis with rheumatoid factor of other specified site without organ or systems involvement: Secondary | ICD-10-CM | POA: Diagnosis not present

## 2023-09-07 DIAGNOSIS — R42 Dizziness and giddiness: Secondary | ICD-10-CM

## 2023-09-07 DIAGNOSIS — R432 Parageusia: Secondary | ICD-10-CM | POA: Diagnosis not present

## 2023-09-07 HISTORY — DX: Dizziness and giddiness: R42

## 2023-09-07 HISTORY — DX: Parageusia: R43.2

## 2023-09-07 NOTE — Telephone Encounter (Signed)
[facility] Patient Advocate Encounter   Received notification from [employer] Medicare Part d that prior authorization for Sivextro is required.   PA submitted on 09/07/23 Key [id number] Status is pending    [facility] will continue to follow.   Clearance Coots, CPhT Specialty Pharmacy Patient [facility] for Infectious Disease Phone: [phone] Fax:  [phone]

## 2023-09-07 NOTE — Telephone Encounter (Signed)
[facility] Patient Advocate Encounter  Prior Authorization for Sivextro has been approved.    PA# [id number] Effective dates: 09/07/23 through 10/12/24  Patients co-pay is $2773.09.   There is no patient assistance available for this medication.  [facility] will continue to follow.  Clearance Coots, CPhT Specialty Pharmacy Patient [facility] for Infectious Disease Phone: [phone] Fax:  [phone]

## 2023-09-07 NOTE — Progress Notes (Signed)
Subjective:   Chief complaint: difficulty tolerating the the linezolid    Patient ID: Allison Randall, female    DOB: 01/21/1951, 72 y.o.   MRN: [id number]  HPI  Allison Randall is a 72 year-old lady who has a past medical history significant for rheumatoid arthritis, hyperlipidemia HSV infection, who had been getting regular treatment with saline injections to treat varicose veins who developed mycobacterium chelonae infection  Interim history:    The past these had gone without complication but however this past February she says that the typical erythematous areas in her legs that ensue after injection of the saline failed to resolve and several areas on her upper legs in particular became more erythematous and more inflamed.  She was given several rounds of antibiotics and seen by dermatology who ultimately performed a biopsy which came back with granulomatous pathology as well as evidence of HSV faction with inclusion bodies present and stains positive.  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 Emily Randall.  M chelonae I susceptibilities are shown before and pictured.    Interim history:  The only reasonable options I saw at that time for her would be oral azithromycin 500 mg along with clofazimine 2 tablets once daily.  She started clofazimine and azithromycin.  She seem to be doing pretty well.  Then in June 2022 she went to a very loud country music concert and afterwards noticed that she had loss of hearing and also tinnitus this persisted and continues to the present day and she is visibly hard of hearing in the clinic.  She had looked up possibility of azithromycin causing hearing loss and while rare it can indeed do so.  I had heard of this I had counseled her to stop the antibiotics but she wanted to continue at the time since there were not many antibiotic options.  UNFortunately as  predicted off antibiotics her Mycobacterium had begun to cause her problems again and lesions have grown and one has become purulent. She is for cataract surgery in October as well as come concerned about the fact that she still has soft tissue infection.  I assured her this should not affect her cataract surgery.  We were able to get her an infusion of IV Nuzyra, and now her Medicare drug plan will cover Allison Randall though it is still leaving her with $3300 plus co-pay for the first 30 days of tablets.  We were able to get 15 days of tablets from the drug company an additional 15 days which we have given her.  Another option were contemplating was therapy with clofazimine and bedalaquine but I worried about QT prolonging effects of both the clofazamine and bedalaquine.  She also has been on Zofran and had nausea.  We obtained a twelve-lead EKG at last visit which showed a QT that was prolonged to 422 ms and a QTC of 462 ms with normal sinus rhythm and some preventricular ventricular contractions  Retruned  to clinic for follow-up ==and we obtained a repeat EKG which now shows a QT of 387 and a QTC of 426 ms with normal sinus rhythm.  She continued of improvement in the areas in her legs.  Her hearing loss completely returned to normal.  She then  developed several areas of hyperpigmentation in her legs including the calves thighs arms and upper chest.  She also has dry skin and intense pruritus.  Upon  examining them in   at prior visit they seem consistent with a tetracycline induced hyperpigmentation rash.  In March  wwe stopped her antimycobacterial therapy and have been observing her off therapy.   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  She continues to be followed with dermatology.  She then developed new lesions on her left lower leg, left upper thigh with largest and most tender lesion and lesion in that thigh but also a lesion in her  right arm.  Biopsies were performed and pathology from the left lower extremity revealed some mycobacteria within a granulomatous pathology.  The cultures apparently that were taken did not arrive at proper temperature for the organism to be cultured and so no AFB culture sensitivity was obtained there.  The patient tells me that her right upper arm lesion was biopsied roughly a week prior to her visit with me in May.  Those cultures have indeed yielded Mycobacterium chelonae and sensitivity data is still pending with the lab have been sent to [place].    She has stopped her MTX.  I have referred her to Allison Barefoot, MD at [facility] she has an appoint with him coming up at the end of July.  Interestingly since I last saw her several of these lesions have improved in their appearance despite her not having received therapy that would be directed against mycobacterial infection.  Is a new area on her arm where the biopsy was taken that is become more hyperpigmented    She saw Allison Randall on 05/11/2023. He was concerned that the patient could have disseminated disease, given lesions in multiple locations including now her arms.  He felt first step was to get a  AFB blood culture to determine if she in fact has disseminated infection.  He also ebiopsied the leg so we can get an isolate on hand and send it for susceptibility testing to [place]. He was not optimistic about the prospects for phage therapy here but an isolate would be necessary if this were to be pursued.  He recommended in the interim applying heat therapy and he has apparently cured patients with heat therapy alone. He advised [name] to apply hand warmers to the affected sites for about an hour twice daily. I  In addition to that he recommended topical i imiquimod therapy. He  recommended applying imiquimod cream to each affected area  daily 5 days per week (Monday-Friday).   He recommended starting with  an 8-week course and  reassess. He also favored holding the methotrexate.   Her rheumatologist has given her a prescription for hydroxychloroquine and using that instead is fine by me; there are some QT issues that may complicate future antibiotic treatment but that can be managed with close monitoring.  Susceptibility testing from Dr Dellia Nims office showed usceptible to clarithromycin (MIC 0.25), tobramycin, and linezolid; I to amikacin and doxycycline, R to cefoxitin, ciprofloxacin, moxifloxacin, and imipenem. Favorable MICs to clofazimine (0.25) and tigecycline (0.12). He recommended that if the heat therapy/imiquimod therapy did not work that a 2 drug regimen of zithromycin 250 mg daily plus linezolid 600 mg daily would be a reaonable approach.    [facility] lab running of her last visit in August and there has been no growth to date which 1 would expect to have already happened given that M chelonae is a rapid grower.  When applying the heat therapy twice a day when she can do sometimes it  is only once a day.  There had been some slight improvement in some of the lesions at last visit".  Since then she has been treated by a provider [facility] for cellulitis several times with doxycycline with improvement of this area on RLE  FORTUNATELY her blood culture at [facility] was negative and final for AFB.  Her soft tissue culture WAS + for M chelonae with sensitivities as below:    Topical Aldara and heat therapy has not improved her lesions and they have actually progressed.  Will therefore initiate therapy at last visit of azithromycin and linezolid, she then developed tinnitus and we switched to clofazamine and zyvox.  We last saw Allison Randall she she has been experiencing lossobtained in her mouth and dizziness that she attributes to the Zyvox.  She stopped this and continue clofazimine alone which have asked her not to stop.  She is also experiencing worsening of her tinnitus at times though she is not on the  azithromycin.  He returns today to clinic for consideration of a different regimen.  Her organism was susceptible to Bactrim though our pharmacy team was not enthusiastic about the potency of his drug against mycobacterial infections.   Cefoxitin, Carbapenems and FQ were all RESISTANT  Tygacil was active as would be omadacycline.  On therapy her lesions had diminished in size.   He also developed a lesion under her fingernail where she jabbed a stick by accident under the nail.  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    Social History   Socioeconomic History   Marital status: Married    Spouse name: Not on file   Number of children: Not on file   Years of education: Not on file   Highest education level: Not on file  Occupational History   Not on file  Tobacco Use   Smoking status: Former    Current packs/day: 0.00    Types: Cigarettes    Quit date: 11/30/2007    Years since quitting: 15.7   Smokeless tobacco: Never  Substance and Sexual Activity   Alcohol use: No   Drug use: No   Sexual activity: Not on file  Other Topics Concern   Not on file  Social History Narrative   Not on file   Social Determinants of Health   Financial Resource Strain: Low Risk  (07/29/2022)    Received from [facility] prior to 12/13/2022., [facility] visits prior to 12/13/2022.   Overall Financial Resource Strain (CARDIA)    Difficulty of Paying Living Expenses: Not hard at all  Food Insecurity: Low Risk  (07/30/2023)   [facility]   Hunger Vital Sign    Worried About Running Out of Food in the Last Year: Never true    Ran Out of Food in the Last Year: Never true  Transportation Needs: No Transportation Needs (07/30/2023)   Received from [facility]    In the past 12 months, has lack of reliable transportation kept you from medical appointments, meetings, work or from getting things needed for daily living? : No  Physical Activity: Insufficiently Active (07/29/2022)   Received from [facility] visits prior to 12/13/2022., [facility] visits prior to 12/13/2022.   Exercise Vital Sign    Days of Exercise per Week: 2 days    Minutes of Exercise per Session: 60 min  Stress: No Stress Concern Present (07/29/2022)   [facility] prior to 12/13/2022., [facility] visits prior to 12/13/2022.   [employer] of Occupational Health - Occupational Stress Questionnaire    Feeling of Stress : Not at all  Social Connections: Moderately Integrated (07/29/2022)   [facility] from [facility] visits prior to 12/13/2022., [facility] visits prior to 12/13/2022.   Social Connection and Isolation Panel [NHANES]    Frequency of Communication with Friends and Family: Three times a week    Frequency of Social Gatherings with Friends and Family: Once a week    Attends Religious Services: 1 to 4 times per year    Active Member of [employer] or Organizations: No     Attends Engineer, structural: Patient declined    Marital Status: Married    Allergies  Allergen Reactions   Azithromycin Tinitus    Hearing loss and tinnitus   Augmentin [Amoxicillin-Pot Clavulanate] Cough    Redness on face chest and arms    No Known Allergies      Current Outpatient Medications:    atorvastatin (LIPITOR) 20 MG tablet, Take 20 mg by mouth daily at 6 PM., Disp: , Rfl:    Calcium-Vitamin D-Vitamin K 500-100-40 MG-UNT-MCG CHEW, Chew 2 tablets by mouth daily., Disp: , Rfl:    cholecalciferol (VITAMIN D) 1000 UNITS tablet, Take 1,000 Units by mouth daily., Disp: , Rfl:    clofazimine 50 mg CAPS capsule (for compassionate use), Take 2 capsules (100 mg total) by mouth daily with breakfast., Disp: 200 capsule, Rfl: 0   cycloSPORINE (RESTASIS) 0.05 % ophthalmic emulsion, Place 1 drop into both eyes 2 (two) times daily., Disp: , Rfl:    folic acid (FOLVITE) 1 MG tablet, Take 1 mg by mouth 2 (two) times daily. , Disp: , Rfl:    imiquimod (ALDARA) 5 % cream, Apply to affected areas 5 days every week x 60 days, Disp: 48 each, Rfl: 0   Krill Oil Omega-3 300 MG CAPS, Take 1 capsule by mouth daily., Disp: , Rfl:    levothyroxine (SYNTHROID, LEVOTHROID) 112 MCG tablet, Take 112 mcg by mouth daily before breakfast., Disp: , Rfl:    linezolid (ZYVOX) 600 MG tablet, Take 1 tablet (600 mg total) by mouth daily., Disp: 30 tablet, Rfl: 6   losartan-hydrochlorothiazide (HYZAAR) 100-12.5 MG per tablet, Take 1 tablet by mouth daily., Disp: , Rfl:    Lysine 1000 MG TABS, Take 1,000 mg by mouth 2 (two) times daily., Disp: , Rfl:    methocarbamol (ROBAXIN) 500 MG tablet, Take 1  tablet (500 mg total) by mouth every 6 (six) hours as needed for muscle spasms., Disp: 60 tablet, Rfl: 0   Multiple Vitamin (MULITIVITAMIN WITH MINERALS) TABS, Take 1 tablet by mouth daily., Disp: , Rfl:    Probiotic Product (PROBIOTIC PO), Take 1 tablet by mouth daily., Disp: , Rfl:    valACYclovir (VALTREX) 500  MG tablet, Take 2,000 mg by mouth 2 (two) times daily as needed (cold sores). , Disp: , Rfl:    zolpidem (AMBIEN) 10 MG tablet, Take 2.5 mg by mouth at bedtime as needed for sleep., Disp: , Rfl:    Review of Systems  Constitutional:  Positive for fatigue. Negative for chills and fever.  HENT:  Negative for congestion and sore throat.   Eyes:  Negative for photophobia.  Respiratory:  Negative for cough, shortness of breath and wheezing.   Cardiovascular:  Negative for chest pain, palpitations and leg swelling.  Gastrointestinal:  Negative for abdominal pain, blood in stool, constipation, diarrhea, nausea and vomiting.  Genitourinary:  Negative for dysuria, flank pain and hematuria.  Musculoskeletal:  Negative for back pain and myalgias.  Skin:  Positive for color change. Negative for rash.  Neurological:  Negative for dizziness, weakness and headaches.  Hematological:  Does not bruise/bleed easily.  Psychiatric/Behavioral:  Negative for agitation, behavioral problems, confusion, decreased concentration and suicidal ideas.        Objective:   Physical Exam Exam conducted with a chaperone present.  Constitutional:      General: She is not in acute distress.    Appearance: Normal appearance. She is well-developed. She is not ill-appearing or diaphoretic.  HENT:     Head: Normocephalic and atraumatic.     Right Ear: Hearing and external ear normal.     Left Ear: Hearing and external ear normal.     Nose: No nasal deformity or rhinorrhea.  Eyes:     General: No scleral icterus.    Conjunctiva/sclera: Conjunctivae normal.     Right eye: Right conjunctiva is not injected.     Left eye: Left conjunctiva is not injected.     Pupils: Pupils are equal, round, and reactive to light.  Neck:     Vascular: No JVD.  Cardiovascular:     Rate and Rhythm: Normal rate and regular rhythm.     Heart sounds: S1 normal and S2 normal.  Pulmonary:     Effort: Pulmonary effort is normal. No  respiratory distress.     Breath sounds: No wheezing.  Abdominal:     General: Bowel sounds are normal. There is no distension.     Palpations: Abdomen is soft.     Tenderness: There is no abdominal tenderness.  Musculoskeletal:        General: Normal range of motion.     Right shoulder: Normal.     Left shoulder: Normal.     Cervical back: Normal range of motion and neck supple.     Right hip: Normal.     Left hip: Normal.     Right knee: Normal.     Left knee: Normal.  Lymphadenopathy:     Head:     Right side of head: No submandibular, preauricular or posterior auricular adenopathy.     Left side of head: No submandibular, preauricular or posterior auricular adenopathy.     Cervical: No cervical adenopathy.     Right cervical: No superficial or deep cervical adenopathy.    Left cervical: No superficial or deep cervical adenopathy.  Skin:  General: Skin is warm and dry.     Coloration: Skin is not pale.     Findings: No abrasion, bruising, ecchymosis, erythema, lesion or rash.     Nails: There is no clubbing.  Neurological:     Mental Status: She is alert and oriented to person, place, and time.     Sensory: No sensory deficit.     Coordination: Coordination normal.     Gait: Gait normal.  Psychiatric:        Attention and Perception: She is attentive.        Mood and Affect: Mood normal.        Speech: Speech normal.        Behavior: Behavior normal. Behavior is cooperative.        Thought Content: Thought content normal.        Judgment: Judgment normal.        08/13/2023 Right leg      08/13/2023 Right leg   Left leg     Left leg near thigh,inguinal area     Right arm      09/07/2023:  Arm     Right leg       Left leg     Left thigh    Fingernail lesion    Mycobacterium chelonae soft tissue infection  Team is looking of the possibility that tedezolid might be covered bit skeptical since we do not have the  greatest success with Medicare  They were not enthusiastic about Bactrim having activity  Other considerations would be to rechallenge with azithromycin since her hearing did not worsen it is just that she thought the tinnitus was worse though now she is having worsening of tinnitus despite not being on the macrolide.  Additionally the possibility would be to rechallenge her with Zyvox provided the loss of taste and dizziness are reversible side effects that we will go away when she finishes treatment.  Finally therapy with Allison Randall orally though this was cost prohibitive in the past or tigecycline IV but she is not enthusiastic about IV meds would be options  Allison Barefoot, MD at [facility] regarding her case as he is the local clinical expert in mycobacteria and he has seen the patient before  Lesion she can apply topical Lamisil I do not want to give her empiric systemic antifungals for sporotrichosis or more conventional onychomycosis.  Rheumatoid Tritus she is off her methotrexate which is frustrating for her.  I have personally spent 44 minutes involved in face-to-face and non-face-to-face activities for this patient on the day of the visit. Professional time spent includes the following activities: Preparing to see the patient (review of tests), Obtaining and/or reviewing separately obtained history (admission/discharge record), Performing a medically appropriate examination and/or evaluation , Ordering medications/tests/procedures, referring and communicating with other health care professionals, Documenting clinical information in the EMR, Independently interpreting results (not separately reported), Communicating results to the patient/family/caregiver, Counseling and educating the patient/family/caregiver and Care coordination (not separately reported).

## 2023-09-09 ENCOUNTER — Telehealth: Payer: Self-pay | Admitting: Infectious Disease

## 2023-09-09 NOTE — Telephone Encounter (Signed)
Patient's case was discussed via email with Dr. Valentina Lucks at [facility].  Dr. Valentina Lucks would favor reinitiating azithromycin to 50 mg with clofazimine with monthly audiology assessments.  Alternatively he mentioned that we could really start her Zyvox 600 mg with clofazimine and try to obtain drug levels to see if she is above the therapeutic range with 600 mg of Zyvox and we can go to a lower dose doing the therapeutic drug monitoring though has not that simple the only lab I found that can do it so far as [facility].  I would like to see what Zulma would prefer, restarting the azithromycin with clofazamine or restarting the zyvox with clofazamine  I would want her to get set up with audiology if we go for first option and may want to get a baseline repeat test before starting azithromycin again

## 2023-09-09 NOTE — Telephone Encounter (Signed)
Sounds great - and did you mean azithromycin 250 mg once daily?

## 2023-09-14 ENCOUNTER — Other Ambulatory Visit: Payer: Self-pay | Admitting: Infectious Disease

## 2023-09-14 DIAGNOSIS — H9319 Tinnitus, unspecified ear: Secondary | ICD-10-CM

## 2023-09-14 DIAGNOSIS — H9190 Unspecified hearing loss, unspecified ear: Secondary | ICD-10-CM

## 2023-09-14 NOTE — Telephone Encounter (Signed)
Allison Randall were you going to speak with this patient and send medication in?

## 2023-09-15 ENCOUNTER — Other Ambulatory Visit (HOSPITAL_COMMUNITY): Payer: Self-pay

## 2023-09-15 NOTE — Telephone Encounter (Signed)
She mentioned she was open to retrialing omadacycline and was willing to pay for it. We do have several sample boxes here as well. Reviewed that her copay may look different this month as she's met her deductible versus in January. She said she was open to retrialing azithromycin if needed as well but is very concerned about losing her hearing since her husband has lost most of his hearing. She would like to avoid retrialing linezolid. Let me know your updated thoughts.

## 2023-09-15 NOTE — Telephone Encounter (Signed)
LVM to follow up on omadacycline discussion. Right now, her copay would be ~$2800. We have ~30 days of samples available to provide her.

## 2023-09-15 NOTE — Telephone Encounter (Signed)
Discussed plan with Cotrina again. She actually wants to retrial azithromycin 250 mg with audiology exams. Will provide her with audiology number so she can reach out to them as well. Understands she will require a baseline exam followed by monthly exams while on treatment. Asked that she please let us know when her first exam is scheduled so that we can restart treatment after this.

## 2023-09-16 ENCOUNTER — Ambulatory Visit: Payer: Medicare Other | Admitting: Infectious Disease

## 2023-09-16 ENCOUNTER — Encounter: Payer: Self-pay | Admitting: Pharmacist

## 2023-09-16 NOTE — Telephone Encounter (Signed)
FYI

## 2023-09-17 ENCOUNTER — Ambulatory Visit: Payer: Medicare Other | Attending: Infectious Disease | Admitting: Audiologist

## 2023-09-17 DIAGNOSIS — H903 Sensorineural hearing loss, bilateral: Secondary | ICD-10-CM | POA: Diagnosis present

## 2023-09-17 NOTE — Procedures (Signed)
  Outpatient Audiology and [facility] [address] [place], [place]  [address] [phone]  Ototoxic Monitoring Baseline Audiologic Evaluation   NAME: Allison Randall     DOB:   1951/02/12      MRN: [id number]                                                                                     DATE: 09/17/2023     REFERENT: Andrey Campanile STATUS: Outpatient DIAGNOSIS: Audiologic Evaluation for the Purpose of Ototoxic Monitoring, Sensorineural Hearing Loss  History: Briani was seen for an audiological evaluation. Katalaya is receiving a hearing evaluation to establish their baseline hearing thresholds before treatment with ototoxic mediation. Last time she received azithromycin she lost her hearing. Her hearing returned after stopping the medications but she still has high pitched tinnitus. Chelcea denies any difficulty hearing today.  Results from today's evaluation will be used to measure changes in patient's hearing thresholds during and six months after ototoxic medication exposure.   Evaluation:  Otoscopy showed a clear view of the tympanic membranes, bilaterally Tympanometry results were consistent with normal middle ear function, bilaterally  Audiometric testing was completed using conventional and high frequency audiometry with high frequency transducer. Word Recognition was excellent at 65dB in the right ear and 70dB in the left. Pure tone thresholds show normal sloping after 1kHz to moderate sensorineural hearing loss in both ears. High frequency audiometry showed moderately severe thresholds 8-16khz.   Sensitive Range to Ototoxicity (SRO)  is 12.5k to 16k Hz  three highest frequency thresholds below 100dB    Results:  The test results were reviewed with Diana.  Today's baseline audiological evaluation showed a sloping sensorineural hearing loss. Her sensitive range to ototoxicity is above 8kHz bilaterally which is above the range of speech.  At future appointments only SRO  threshold testing.     Recommendations: 1.   Return for monitoring audiological evaluation 10/22/2023.     36 minutes spent testing and counseling on results.    Ammie Ferrier  Audiologist, Au.D., CCC-A 09/17/2023  3:30 PM  Cc:  Andrey Campanile

## 2023-09-21 NOTE — Telephone Encounter (Signed)
Her baseline audiology shows baseline senorineural hearing loss. They have her next appt scheduled in January> I think she should restart the azithromycin with clofazamine now and monitor herself for symptoms and then have repeat [facility] audiology exams. Does she have another appt w me as well/?

## 2023-10-22 ENCOUNTER — Ambulatory Visit: Payer: Medicare Other | Attending: Infectious Disease | Admitting: Audiologist

## 2023-10-22 DIAGNOSIS — H938X3 Other specified disorders of ear, bilateral: Secondary | ICD-10-CM | POA: Diagnosis present

## 2023-10-22 DIAGNOSIS — H903 Sensorineural hearing loss, bilateral: Secondary | ICD-10-CM | POA: Insufficient documentation

## 2023-10-22 NOTE — Procedures (Signed)
  Outpatient Audiology and [facility] [address] [place], [place]  [address] [phone]  AUDIOLOGICAL  EVALUATION  NAME: Allison Randall     DOB:   07-Oct-1951      MRN: [id number]                                                                                     DATE: 10/22/2023     REFERENT: Tanda Prentice DEL, MD STATUS: Outpatient DIAGNOSIS: Sensorineural Hearing Loss, Ototoxic Monitoring    History: Rella was seen for a hearing screening to monitor hearing while receiving care with ototoxic medication.   Results:  High frequency SRO threshold stable. No change. No indication of progression.   Recommendations: Next ototoxic monitoring screening scheduled for: 11/19/2023  11 minutes spent testing and counseling on results.   If you have any questions please feel free to contact me at [phone].  [name] Au.D.  Audiologist   10/22/2023  11:10 AM  Cc: Tanda Prentice DEL, MD

## 2023-10-26 ENCOUNTER — Encounter: Payer: Self-pay | Admitting: Infectious Disease

## 2023-10-26 DIAGNOSIS — H905 Unspecified sensorineural hearing loss: Secondary | ICD-10-CM | POA: Insufficient documentation

## 2023-10-26 HISTORY — DX: Unspecified sensorineural hearing loss: H90.5

## 2023-10-27 ENCOUNTER — Ambulatory Visit: Payer: Self-pay | Admitting: Infectious Disease

## 2023-10-27 NOTE — Progress Notes (Signed)
 Subjective:   Chief complaint: difficulty tolerating the the linezolid     Patient ID: Allison Randall, female    DOB: 05/24/1951, 73 y.o.   MRN: [id number]  HPI  Allison Randall is a 73 year-old lady who has a past medical history significant for rheumatoid arthritis, hyperlipidemia HSV infection, who had been getting regular treatment with saline injections to treat varicose veins who developed mycobacterium chelonae infection  Interim history:    The past these had gone without complication but however this past February she says that the typical erythematous areas in her legs that ensue after injection of the saline failed to resolve and several areas on her upper legs in particular became more erythematous and more inflamed.  She was given several rounds of antibiotics and seen by dermatology who ultimately performed a biopsy which came back with granulomatous pathology as well as evidence of HSV faction with inclusion bodies present and stains positive.  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 Joanne Muckle.  M chelonae I susceptibilities are shown before and pictured.    Interim history:  The only reasonable options I saw at that time for her would be oral azithromycin  500 mg along with clofazimine  2 tablets once daily.  She started clofazimine  and azithromycin .  She seem to be doing pretty well.  Then in June 2022 she went to a very loud country music concert and afterwards noticed that she had loss of hearing and also tinnitus this persisted and continues to the present day and she is visibly hard of hearing in the clinic.  She had looked up possibility of azithromycin  causing hearing loss and while rare it can indeed do so.  I had heard of this I had counseled her to stop the antibiotics but she wanted to continue at the time since there were not many antibiotic options.  UNFortunately as  predicted off antibiotics her Mycobacterium had begun to cause her problems again and lesions have grown and one has become purulent. She is for cataract surgery in October as well as come concerned about the fact that she still has soft tissue infection.  I assured her this should not affect her cataract surgery.  We were able to get her an infusion of IV Nuzyra , and now her Medicare drug plan will cover Nuzyra  though it is still leaving her with $3300 plus co-pay for the first 30 days of tablets.  We were able to get 15 days of tablets from the drug company an additional 15 days which we have given her.  Another option were contemplating was therapy with clofazimine  and bedalaquine but I worried about QT prolonging effects of both the clofazamine and bedalaquine.  She also has been on Zofran  and had nausea.  We obtained a twelve-lead EKG at last visit which showed a QT that was prolonged to 422 ms and a QTC of 462 ms with normal sinus rhythm and some preventricular ventricular contractions  Retruned  to clinic for follow-up ==and we obtained a repeat EKG which now shows a QT of 387 and a QTC of 426 ms with normal sinus rhythm.  She continued of improvement in the areas in her legs.  Her hearing loss completely returned to normal.  She then  developed several areas of hyperpigmentation in her legs including the calves thighs arms and upper chest.  She also has dry skin and intense pruritus.  Upon  examining them in   at prior visit they seem consistent with a tetracycline induced hyperpigmentation rash.  In March  wwe stopped her antimycobacterial therapy and have been observing her off therapy.   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  She continues to be followed with dermatology.  She then developed new lesions on her left lower leg, left upper thigh with largest and most tender lesion and lesion in that thigh but also a lesion in her  right arm.  Biopsies were performed and pathology from the left lower extremity revealed some mycobacteria within a granulomatous pathology.  The cultures apparently that were taken did not arrive at proper temperature for the organism to be cultured and so no AFB culture sensitivity was obtained there.  The patient tells me that her right upper arm lesion was biopsied roughly a week prior to her visit with me in May.  Those cultures have indeed yielded Mycobacterium chelonae and sensitivity data is still pending with the lab have been sent to [place] .    She has stopped her MTX.  I have referred her to Mel Spine, MD at [facility] she has an appoint with him coming up at the end of July.  Interestingly since I last saw her several of these lesions have improved in their appearance despite her not having received therapy that would be directed against mycobacterial infection.  Is a new area on her arm where the biopsy was taken that is become more hyperpigmented    She saw Mel Spine on 05/11/2023. He was concerned that the patient could have disseminated disease, given lesions in multiple locations including now her arms.  He felt first step was to get a  AFB blood culture to determine if she in fact has disseminated infection.  He also ebiopsied the leg so we can get an isolate on hand and send it for susceptibility testing to [place] . He was not optimistic about the prospects for phage therapy here but an isolate would be necessary if this were to be pursued.  He recommended in the interim applying heat therapy and he has apparently cured patients with heat therapy alone. He advised [name] to apply hand warmers to the affected sites for about an hour twice daily. I  In addition to that he recommended topical i imiquimod  therapy. He  recommended applying imiquimod  cream to each affected area  daily 5 days per week (Monday-Friday).   He recommended starting with  an 8-week course and  reassess. He also favored holding the methotrexate.   Her rheumatologist has given her a prescription for hydroxychloroquine and using that instead is fine by me; there are some QT issues that may complicate future antibiotic treatment but that can be managed with close monitoring.  Susceptibility testing from Dr Alessandra Hussar office showed usceptible to clarithromycin (MIC 0.25), tobramycin, and linezolid ; I to amikacin and doxycycline , R to cefoxitin, ciprofloxacin, moxifloxacin, and imipenem. Favorable MICs to clofazimine  (0.25) and tigecycline (0.12). He recommended that if the heat therapy/imiquimod  therapy did not work that a 2 drug regimen of zithromycin 250 mg daily plus linezolid  600 mg daily would be a reaonable approach.    [facility] lab running of her last visit in August and there has been no growth to date which 1 would expect to have already happened given that M chelonae is a rapid grower.  When applying the heat therapy twice a day when she can do sometimes it  is only once a day.  There had been some slight improvement in some of the lesions at last visit".  Since then she has been treated by a provider at [facility] for cellulitis several times with doxycycline  with improvement of this area on RLE  FORTUNATELY her blood culture at [facility] was negative and final for AFB.  Her soft tissue culture WAS + for M chelonae with sensitivities as below:    Topical Aldara  and heat therapy has not improved her lesions and they have actually progressed.  Will therefore initiate therapy at last visit of azithromycin  and linezolid , she then developed tinnitus and we switched to clofazamine and zyvox .  We last saw Aitiana she she has been experiencing lossobtained in her mouth and dizziness that she attributes to the Zyvox .  She stopped this and continue clofazimine  alone which have asked her not to stop.  She is also experiencing worsening of her tinnitus at times though she is not on the  azithromycin .  He returns today to clinic for consideration of a different regimen.  Her organism was susceptible to Bactrim though our pharmacy team was not enthusiastic about the potency of his drug against mycobacterial infections.   Cefoxitin, Carbapenems and FQ were all RESISTANT  Tygacil was active as would be omadacycline .  On therapy her lesions had diminished in size.  We ended up opting for azithromycin  and clofazamine after discussion with Dr. Gretchen Leavell with [facility] with Audiology BEFORE starting macrolide and monthly after starting it.  She has had NO change in her hearing on audiology or subjectively.  Since being on azithromycin  and clofazimine  she is experienced a change in her cutaneous lesions that have become less angry and weepy and are starting to regress already some.  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    Social History    Socioeconomic History   Marital status: Married    Spouse name: Not on file   Number of children: Not on file   Years of education: Not on file   Highest education level: Not on file  Occupational History   Not on file  Tobacco Use   Smoking status: Former    Current packs/day: 0.00    Types: Cigarettes    Quit date: 11/30/2007    Years since quitting: 15.9   Smokeless tobacco: Never  Substance and Sexual Activity   Alcohol use:  No   Drug use: No   Sexual activity: Not on file  Other Topics Concern   Not on file  Social History Narrative   Not on file   Social Drivers of Health   Financial Resource Strain: Low Risk  (07/29/2022)   [facility] from [facility] prior to 12/13/2022., [facility] visits prior to 12/13/2022.   Overall Financial Resource Strain (CARDIA)    Difficulty of Paying Living Expenses: Not hard at all  Food Insecurity: Low Risk  (07/30/2023)   Received from [facility]   Hunger Vital Sign    Worried About Running Out of Food in the Last Year: Never true    Ran Out of Food in the Last Year: Never true  Transportation Needs: No Transportation Needs (07/30/2023)   Received from [facility]    In the past 12 months, has lack of reliable transportation kept you from medical appointments, meetings, work or from getting things needed for daily living? : No  Physical Activity: Insufficiently Active (07/29/2022)   [facility] from [facility] visits prior to 12/13/2022., [facility] visits prior to 12/13/2022.   Exercise Vital Sign    Days of Exercise per Week: 2 days    Minutes of Exercise per Session: 60 min  Stress: No Stress Concern Present (07/29/2022)   [facility] prior to 12/13/2022., [facility]  visits prior to 12/13/2022.   [employer] of Occupational Health - Occupational Stress Questionnaire    Feeling of Stress : Not at all  Social Connections: Moderately Integrated (07/29/2022)   [facility] from [facility] visits prior to 12/13/2022., [facility] visits prior to 12/13/2022.   Social Connection and Isolation Panel [NHANES]    Frequency of Communication with Friends and Family: Three times a week    Frequency of Social Gatherings with Friends and Family: Once a week    Attends Religious Services: 1 to 4 times per year    Active Member of [employer] or Organizations: No    Attends Engineer, structural: Patient declined    Marital Status: Married    Allergies  Allergen Reactions   Azithromycin  Tinitus    Hearing loss and tinnitus   Augmentin [Amoxicillin-Pot Clavulanate] Cough    Redness on face chest and arms    No Known Allergies      Current Outpatient Medications:    atorvastatin  (LIPITOR) 20 MG tablet, Take 20 mg by mouth daily at 6 PM., Disp: , Rfl:    Calcium -Vitamin D-Vitamin K 500-100-40 MG-UNT-MCG CHEW, Chew 2 tablets by mouth daily., Disp: , Rfl:    cholecalciferol (VITAMIN D) 1000 UNITS tablet, Take 1,000 Units by mouth daily., Disp: , Rfl:    clofazimine  50 mg CAPS capsule (for compassionate use), Take 2 capsules (100 mg total) by mouth daily with breakfast., Disp: 200 capsule, Rfl: 0   cycloSPORINE  (RESTASIS ) 0.05 % ophthalmic emulsion, Place 1 drop into both eyes 2 (two) times daily., Disp: , Rfl:    folic acid (FOLVITE) 1 MG tablet, Take 1 mg by mouth 2 (two) times daily. , Disp: , Rfl:    imiquimod  (ALDARA ) 5 % cream, Apply to affected areas 5 days every week x 60 days, Disp: 48 each, Rfl: 0  Krill Oil Omega-3 300 MG CAPS, Take 1 capsule by mouth daily., Disp: , Rfl:    levothyroxine  (SYNTHROID , LEVOTHROID) 112 MCG tablet, Take 112 mcg by mouth daily before breakfast., Disp: ,  Rfl:    linezolid  (ZYVOX ) 600 MG tablet, Take 1 tablet (600 mg total) by mouth daily. (Patient not taking: Reported on 09/07/2023), Disp: 30 tablet, Rfl: 6   losartan -hydrochlorothiazide  (HYZAAR) 100-12.5 MG per tablet, Take 1 tablet by mouth daily., Disp: , Rfl:    Lysine 1000 MG TABS, Take 1,000 mg by mouth 2 (two) times daily., Disp: , Rfl:    methocarbamol  (ROBAXIN ) 500 MG tablet, Take 1 tablet (500 mg total) by mouth every 6 (six) hours as needed for muscle spasms., Disp: 60 tablet, Rfl: 0   Multiple Vitamin (MULITIVITAMIN WITH MINERALS) TABS, Take 1 tablet by mouth daily., Disp: , Rfl:    Probiotic Product (PROBIOTIC PO), Take 1 tablet by mouth daily., Disp: , Rfl:    valACYclovir (VALTREX) 500 MG tablet, Take 2,000 mg by mouth 2 (two) times daily as needed (cold sores). , Disp: , Rfl:    zolpidem  (AMBIEN ) 10 MG tablet, Take 2.5 mg by mouth at bedtime as needed for sleep., Disp: , Rfl:    Review of Systems  Constitutional:  Negative for activity change, appetite change, chills, diaphoresis, fatigue, fever and unexpected weight change.  HENT:  Negative for congestion, rhinorrhea, sinus pressure, sneezing, sore throat and trouble swallowing.   Eyes:  Negative for photophobia and visual disturbance.  Respiratory:  Negative for cough, chest tightness, shortness of breath, wheezing and stridor.   Cardiovascular:  Negative for chest pain, palpitations and leg swelling.  Gastrointestinal:  Negative for abdominal distention, abdominal pain, anal bleeding, blood in stool, constipation, diarrhea, nausea and vomiting.  Genitourinary:  Negative for difficulty urinating, dysuria, flank pain and hematuria.  Musculoskeletal:  Negative for arthralgias, back pain, gait problem, joint swelling and myalgias.  Skin:  Negative for color change, pallor, rash and wound.  Neurological:  Negative for dizziness, tremors, weakness and light-headedness.  Hematological:  Negative for adenopathy. Does not bruise/bleed  easily.  Psychiatric/Behavioral:  Negative for agitation, behavioral problems, confusion, decreased concentration, dysphoric mood and sleep disturbance.        Objective:   Physical Exam Exam conducted with a chaperone present.  Constitutional:      General: She is not in acute distress.    Appearance: Normal appearance. She is well-developed. She is not ill-appearing or diaphoretic.  HENT:     Head: Normocephalic and atraumatic.     Right Ear: Hearing and external ear normal.     Left Ear: Hearing and external ear normal.     Nose: No nasal deformity or rhinorrhea.  Eyes:     General: No scleral icterus.    Conjunctiva/sclera: Conjunctivae normal.     Right eye: Right conjunctiva is not injected.     Left eye: Left conjunctiva is not injected.     Pupils: Pupils are equal, round, and reactive to light.  Neck:     Vascular: No JVD.  Cardiovascular:     Rate and Rhythm: Normal rate and regular rhythm.     Heart sounds: S1 normal and S2 normal.  Pulmonary:     Effort: Pulmonary effort is normal. No respiratory distress.     Breath sounds: No wheezing.  Abdominal:     General: Bowel sounds are normal. There is no distension.     Palpations: Abdomen is soft.  Tenderness: There is no abdominal tenderness.  Musculoskeletal:        General: Normal range of motion.     Right shoulder: Normal.     Left shoulder: Normal.     Cervical back: Normal range of motion and neck supple.     Right hip: Normal.     Left hip: Normal.     Right knee: Normal.     Left knee: Normal.  Lymphadenopathy:     Head:     Right side of head: No submandibular, preauricular or posterior auricular adenopathy.     Left side of head: No submandibular, preauricular or posterior auricular adenopathy.     Cervical: No cervical adenopathy.     Right cervical: No superficial or deep cervical adenopathy.    Left cervical: No superficial or deep cervical adenopathy.  Skin:    General: Skin is warm and dry.      Coloration: Skin is not pale.     Findings: No abrasion, bruising, ecchymosis, erythema, lesion or rash.     Nails: There is no clubbing.  Neurological:     Mental Status: She is alert and oriented to person, place, and time.     Sensory: No sensory deficit.     Coordination: Coordination normal.     Gait: Gait normal.  Psychiatric:        Attention and Perception: She is attentive.        Mood and Affect: Mood normal.        Speech: Speech normal.        Behavior: Behavior normal. Behavior is cooperative.        Thought Content: Thought content normal.        Judgment: Judgment normal.       08/13/2023 Right leg      08/13/2023 Right leg   Left leg     Left leg near thigh,inguinal area     Right arm      09/07/2023:  Arm     Right leg       Left leg     Left thigh    Fingernail lesion     Right arm 10/28/23:    Right leg 10/28/23:          Left leg 10/28/23:    Thigh not photographed today 10/28/23:     Mycobacterium chelonae soft tissue infection:  Will continue with azithromycin  and clofazimine  and monthly audiology test.  Will push through 6 months if not a year of therapy given the fact that she is an immunocompromised patient.  Will check CMP and CBC w diff  Rheumatoid arthritis: Her RA did flare a bit a few months ago off methotrexate but has become more quiescent recently.  Sensorineural hearing loss: May not have been from the macrolide after overall but regardless she is tolerating current azithromycin  without problems and without objective findings to show any worsening of her hearing loss.    Vaccine counseling recommended Prevnar 20 which she received today

## 2023-10-28 ENCOUNTER — Encounter: Payer: Self-pay | Admitting: Infectious Disease

## 2023-10-28 ENCOUNTER — Ambulatory Visit (INDEPENDENT_AMBULATORY_CARE_PROVIDER_SITE_OTHER): Payer: Medicare Other | Admitting: Infectious Disease

## 2023-10-28 ENCOUNTER — Other Ambulatory Visit: Payer: Self-pay

## 2023-10-28 VITALS — BP 159/85 | HR 80 | Temp 96.6°F | Ht 64.0 in | Wt 145.0 lb

## 2023-10-28 DIAGNOSIS — R42 Dizziness and giddiness: Secondary | ICD-10-CM

## 2023-10-28 DIAGNOSIS — R432 Parageusia: Secondary | ICD-10-CM | POA: Diagnosis not present

## 2023-10-28 DIAGNOSIS — R9431 Abnormal electrocardiogram [ECG] [EKG]: Secondary | ICD-10-CM | POA: Diagnosis not present

## 2023-10-28 DIAGNOSIS — H903 Sensorineural hearing loss, bilateral: Secondary | ICD-10-CM | POA: Diagnosis not present

## 2023-10-28 DIAGNOSIS — A318 Other mycobacterial infections: Secondary | ICD-10-CM

## 2023-10-28 DIAGNOSIS — Z23 Encounter for immunization: Secondary | ICD-10-CM | POA: Diagnosis not present

## 2023-10-28 DIAGNOSIS — C44722 Squamous cell carcinoma of skin of right lower limb, including hip: Secondary | ICD-10-CM

## 2023-10-28 DIAGNOSIS — B009 Herpesviral infection, unspecified: Secondary | ICD-10-CM | POA: Diagnosis not present

## 2023-10-28 DIAGNOSIS — Z7185 Encounter for immunization safety counseling: Secondary | ICD-10-CM

## 2023-10-28 DIAGNOSIS — L270 Generalized skin eruption due to drugs and medicaments taken internally: Secondary | ICD-10-CM | POA: Diagnosis not present

## 2023-10-28 DIAGNOSIS — H9313 Tinnitus, bilateral: Secondary | ICD-10-CM | POA: Diagnosis not present

## 2023-10-29 LAB — CBC WITH DIFFERENTIAL/PLATELET
Absolute Lymphocytes: 2136 {cells}/uL (ref 850–3900)
Absolute Monocytes: 431 {cells}/uL (ref 200–950)
Basophils Absolute: 30 {cells}/uL (ref 0–200)
Basophils Relative: 0.5 %
Eosinophils Absolute: 71 {cells}/uL (ref 15–500)
Eosinophils Relative: 1.2 %
HCT: 43.3 % (ref 35.0–45.0)
Hemoglobin: 14.3 g/dL (ref 11.7–15.5)
MCH: 29.6 pg (ref 27.0–33.0)
MCHC: 33 g/dL (ref 32.0–36.0)
MCV: 89.6 fL (ref 80.0–100.0)
MPV: 9.3 fL (ref 7.5–12.5)
Monocytes Relative: 7.3 %
Neutro Abs: 3233 {cells}/uL (ref 1500–7800)
Neutrophils Relative %: 54.8 %
Platelets: 184 10*3/uL (ref 140–400)
RBC: 4.83 10*6/uL (ref 3.80–5.10)
RDW: 13.3 % (ref 11.0–15.0)
Total Lymphocyte: 36.2 %
WBC: 5.9 10*3/uL (ref 3.8–10.8)

## 2023-10-29 LAB — COMPLETE METABOLIC PANEL WITH GFR
AG Ratio: 1.4 (calc) (ref 1.0–2.5)
ALT: 22 U/L (ref 6–29)
AST: 16 U/L (ref 10–35)
Albumin: 3.6 g/dL (ref 3.6–5.1)
Alkaline phosphatase (APISO): 65 U/L (ref 37–153)
BUN: 23 mg/dL (ref 7–25)
CO2: 30 mmol/L (ref 20–32)
Calcium: 9.3 mg/dL (ref 8.6–10.4)
Chloride: 106 mmol/L (ref 98–110)
Creat: 0.94 mg/dL (ref 0.60–1.00)
Globulin: 2.6 g/dL (ref 1.9–3.7)
Glucose, Bld: 98 mg/dL (ref 65–99)
Potassium: 4.2 mmol/L (ref 3.5–5.3)
Sodium: 143 mmol/L (ref 135–146)
Total Bilirubin: 0.5 mg/dL (ref 0.2–1.2)
Total Protein: 6.2 g/dL (ref 6.1–8.1)
eGFR: 64 mL/min/{1.73_m2} (ref >=60)

## 2023-11-06 ENCOUNTER — Other Ambulatory Visit: Payer: Self-pay | Admitting: Family Medicine

## 2023-11-06 DIAGNOSIS — Z0000 Encounter for general adult medical examination without abnormal findings: Secondary | ICD-10-CM

## 2023-11-12 ENCOUNTER — Telehealth: Payer: Self-pay | Admitting: Pharmacist

## 2023-11-12 NOTE — Telephone Encounter (Signed)
Patient called requesting clofazimine refills today given she has ~2 weeks of capsules left. We currently do not have her specific refills and [employer] system says her application has been cancelled. Cassie is reaching out to them to troubleshoot the issue.   Margarite Gouge, PharmD, CPP, BCIDP, AAHIVP Clinical Pharmacist Practitioner Infectious Diseases Clinical Pharmacist [facility]

## 2023-11-19 ENCOUNTER — Telehealth: Payer: Self-pay | Admitting: Pharmacist

## 2023-11-19 ENCOUNTER — Ambulatory Visit: Payer: Medicare Other | Attending: Infectious Disease | Admitting: Audiologist

## 2023-11-19 DIAGNOSIS — H903 Sensorineural hearing loss, bilateral: Secondary | ICD-10-CM | POA: Diagnosis present

## 2023-11-19 DIAGNOSIS — H938X3 Other specified disorders of ear, bilateral: Secondary | ICD-10-CM | POA: Diagnosis present

## 2023-11-19 NOTE — Telephone Encounter (Signed)
 Patient picked up 2 bottles of clofazimine  today. Supply should last for ~3 months. Next refill due around the beginning/middle of May.  Taygan Connell L. Liza, PharmD, BCIDP, AAHIVP, CPP Clinical Pharmacist Practitioner Infectious Diseases Clinical Pharmacist [facility] 11/19/2023, 11:24 AM

## 2023-11-19 NOTE — Procedures (Signed)
  Outpatient Audiology and [facility] [address] [place], [place]  [address] [phone]  AUDIOLOGICAL  EVALUATION  NAME: Allison Randall     DOB:   March 16, 1951      MRN: [id number]                                                                                     DATE: 11/19/2023     REFERENT: Tanda Prentice DEL, MD STATUS: Outpatient DIAGNOSIS: Sensorineural Hearing Loss, Ototoxic Monitoring     History: Jaquelyne was seen for a hearing screening to monitor hearing while receiving care with ototoxic medication.    Results:  High frequency SRO threshold stable. No change. No indication of progression.    Recommendations: Next ototoxic monitoring screening scheduled for: 12/17/2023   11 minutes spent testing and counseling on results.    If you have any questions please feel free to contact me at [phone].   [name] Au.D.  Audiologist   11/19/2023  12:50 PM  Cc: Tanda Prentice DEL, MD

## 2023-12-17 ENCOUNTER — Ambulatory Visit: Payer: Medicare Other | Attending: Infectious Disease | Admitting: Audiologist

## 2023-12-17 DIAGNOSIS — H903 Sensorineural hearing loss, bilateral: Secondary | ICD-10-CM | POA: Diagnosis present

## 2023-12-17 DIAGNOSIS — H938X3 Other specified disorders of ear, bilateral: Secondary | ICD-10-CM | POA: Diagnosis present

## 2023-12-17 NOTE — Procedures (Signed)
  Outpatient Audiology and [facility] [address] [place], [place]  [address] [phone]  AUDIOLOGICAL  EVALUATION  NAME: Allison Randall     DOB:   May 12, 1951      MRN: [id number]                                                                                     DATE: 12/17/2023     REFERENT: Barbie Banner, MD STATUS: Outpatient  DIAGNOSIS: Sensorineural Hearing Loss, Ototoxic Monitoring     History: Khaniyah was seen for a hearing screening to monitor hearing while receiving care with ototoxic medication.    Results:  High frequency SRO threshold stable. No change. No indication of progression of loss.    Recommendations: Next ototoxic monitoring screening scheduled for: 02/16/2024    15 minutes spent testing and counseling on results.   If you have any questions please feel free to contact me at [phone].  Ammie Ferrier Au.D.  Audiologist   12/17/2023  11:13 AM  Cc: Barbie Banner, MD

## 2023-12-21 ENCOUNTER — Ambulatory Visit
Admission: RE | Admit: 2023-12-21 | Discharge: 2023-12-21 | Disposition: A | Discharge status: Home / Self Care | Payer: Medicare Other | Source: Ambulatory Visit | Attending: Family Medicine

## 2023-12-21 DIAGNOSIS — Z0000 Encounter for general adult medical examination without abnormal findings: Secondary | ICD-10-CM

## 2024-01-21 ENCOUNTER — Ambulatory Visit: Payer: Medicare Other | Admitting: Audiologist

## 2024-01-31 NOTE — Progress Notes (Unsigned)
 Subjective:   Chief complaint: follow-up for M chelonae infection    Patient ID: Allison Randall, female    DOB: February 02, 1951, 73 y.o.   MRN: [id number]  HPI  Allison Randall is a 73 year-old lady who has a past medical history significant for rheumatoid arthritis, hyperlipidemia HSV infection, who had been getting regular treatment with saline injections to treat varicose veins who developed mycobacterium chelonae infection  Interim history:    The past these had gone without complication but however this past February she says that the typical erythematous areas in her legs that ensue after injection of the saline failed to resolve and several areas on her upper legs in particular became more erythematous and more inflamed.  She was given several rounds of antibiotics and seen by dermatology who ultimately performed a biopsy which came back with granulomatous pathology as well as evidence of HSV faction with inclusion bodies present and stains positive.  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 Joanne Muckle.  M chelonae I susceptibilities are shown before and pictured.    Interim history:  The only reasonable options I saw at that time for her would be oral azithromycin  500 mg along with clofazimine  2 tablets once daily.  She started clofazimine  and azithromycin .  She seem to be doing pretty well.  Then in June 2022 she went to a very loud country music concert and afterwards noticed that she had loss of hearing and also tinnitus this persisted and continues to the present day and she is visibly hard of hearing in the clinic.  She had looked up possibility of azithromycin  causing hearing loss and while rare it can indeed do so.  I had heard of this I had counseled her to stop the antibiotics but she wanted to continue at the time since there were not many antibiotic options.  UNFortunately as predicted  off antibiotics her Mycobacterium had begun to cause her problems again and lesions have grown and one has become purulent. She is for cataract surgery in October as well as come concerned about the fact that she still has soft tissue infection.  I assured her this should not affect her cataract surgery.  We were able to get her an infusion of IV Nuzyra , and now her Medicare drug plan will cover Nuzyra  though it is still leaving her with $3300 plus co-pay for the first 30 days of tablets.  We were able to get 15 days of tablets from the drug company an additional 15 days which we have given her.  Another option were contemplating was therapy with clofazimine  and bedalaquine but I worried about QT prolonging effects of both the clofazamine and bedalaquine.  She also has been on Zofran  and had nausea.  We obtained a twelve-lead EKG at last visit which showed a QT that was prolonged to 422 ms and a QTC of 462 ms with normal sinus rhythm and some preventricular ventricular contractions  Retruned  to clinic for follow-up ==and we obtained a repeat EKG which now shows a QT of 387 and a QTC of 426 ms with normal sinus rhythm.  She continued of improvement in the areas in her legs.  Her hearing loss completely returned to normal.  She then  developed several areas of hyperpigmentation in her legs including the calves thighs arms and upper chest.  She also has dry skin and intense pruritus.  Upon  examining them in   at prior visit they seem consistent with a tetracycline induced hyperpigmentation rash.  In March  wwe stopped her antimycobacterial therapy and have been observing her off therapy.   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  She continues to be followed with dermatology.  She then developed new lesions on her left lower leg, left upper thigh with largest and most tender lesion and lesion in that thigh but also a lesion in her right  arm.  Biopsies were performed and pathology from the left lower extremity revealed some mycobacteria within a granulomatous pathology.  The cultures apparently that were taken did not arrive at proper temperature for the organism to be cultured and so no AFB culture sensitivity was obtained there.  The patient tells me that her right upper arm lesion was biopsied roughly a week prior to her visit with me in May.  Those cultures have indeed yielded Mycobacterium chelonae and sensitivity data is still pending with the lab have been sent to [place] .    She has stopped her MTX.  I have referred her to Mel Spine, MD at [facility] she has an appoint with him coming up at the end of July.  Interestingly since I last saw her several of these lesions have improved in their appearance despite her not having received therapy that would be directed against mycobacterial infection.  Is a new area on her arm where the biopsy was taken that is become more hyperpigmented    She saw Mel Spine on 05/11/2023. He was concerned that the patient could have disseminated disease, given lesions in multiple locations including now her arms.  He felt first step was to get a  AFB blood culture to determine if she in fact has disseminated infection.  He also ebiopsied the leg so we can get an isolate on hand and send it for susceptibility testing to [place] . He was not optimistic about the prospects for phage therapy here but an isolate would be necessary if this were to be pursued.  He recommended in the interim applying heat therapy and he has apparently cured patients with heat therapy alone. He advised Ms. Altman to apply hand warmers to the affected sites for about an hour twice daily. I  In addition to that he recommended topical i imiquimod  therapy. He  recommended applying imiquimod  cream to each affected area  daily 5 days per week (Monday-Friday).   He recommended starting with  an 8-week course and  reassess. He also favored holding the methotrexate.   Her rheumatologist has given her a prescription for hydroxychloroquine and using that instead is fine by me; there are some QT issues that may complicate future antibiotic treatment but that can be managed with close monitoring.  Susceptibility testing from Dr Alessandra Hussar office showed usceptible to clarithromycin (MIC 0.25), tobramycin, and linezolid ; I to amikacin and doxycycline , R to cefoxitin, ciprofloxacin, moxifloxacin, and imipenem. Favorable MICs to clofazimine  (0.25) and tigecycline (0.12). He recommended that if the heat therapy/imiquimod  therapy did not work that a 2 drug regimen of zithromycin 250 mg daily plus linezolid  600 mg daily would be a reaonable approach.    [facility] lab running of her last visit in August and there has been no growth to date which 1 would expect to have already happened given that M chelonae is a rapid grower.  When applying the heat therapy twice a day when she can do sometimes it  is only once a day.  There had been some slight improvement in some of the lesions at last visit".  Since then she has been treated by a provider [facility] for cellulitis several times with doxycycline  with improvement of this area on RLE  FORTUNATELY her blood culture at [facility] was negative and final for AFB.  Her soft tissue culture WAS + for M chelonae with sensitivities as below:    Topical Aldara  and heat therapy has not improved her lesions and they have actually progressed.  Will therefore initiate therapy at last visit of azithromycin  and linezolid , she then developed tinnitus and we switched to clofazamine and zyvox .  We last saw Alianah she she has been experiencing lossobtained in her mouth and dizziness that she attributes to the Zyvox .  She stopped this and continue clofazimine  alone which have asked her not to stop.  She is also experiencing worsening of her tinnitus at times though she is not on the  azithromycin .  She returned today to clinic for consideration of a different regimen.  Her organism was susceptible to Bactrim though our pharmacy team was not enthusiastic about the potency of his drug against mycobacterial infections.   Cefoxitin, Carbapenems and FQ were all RESISTANT  Tygacil was active as would be omadacycline .  On therapy her lesions had diminished in size.  We ended up opting for azithromycin  and clofazamine after discussion with Dr. Gretchen Leavell with [facility] with Audiology BEFORE starting macrolide and monthly after starting it.  She has had NO change in her hearing on audiology or subjectively.  Since being on azithromycin  and clofazimine  she is experienced a change in her cutaneous lesions that have become less angry and weepy and are starting to regress already some.  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   Discussed the use of AI scribe software for clinical note transcription with the patient, who gave verbal consent to proceed.  History of Present Illness   The patient, with a history of rheumatoid arthritis and M chelonae infection , has been on azithromycin  and clofazimine  since December. She reports that the skin lesions have improved significantly, except for one persistent lesion. She also mentions a new skin issue, which she initially thought was a wart and treated with compound W, but it did not resolve. She has an appointment with a dermatologist in four weeks to evaluate this.  In addition to the skin issues, the patient has rheumatoid arthritis but is currently not on any medication for it. She mentions occasional ankle swelling. She also has a history of hearing loss as noted above and is scheduled  to see an audiologist in May. She has noticed that she needs to turn up the volume on her television, but she is unsure if this is due to a change in her hearing or an issue with the television.           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    Social History   Socioeconomic History   Marital status: Married    Spouse name: Not on file   Number of children: Not on file   Years of education: Not on file   Highest education level: Not on file  Occupational History   Not on file  Tobacco Use   Smoking status: Former    Current packs/day: 0.00    Types: Cigarettes    Quit date: 11/30/2007    Years since quitting: 16.1   Smokeless tobacco: Never  Substance and Sexual Activity   Alcohol use: No   Drug use: No   Sexual activity: Not on file  Other Topics Concern   Not on file  Social History Narrative   Not on file   Social Drivers of Health   Financial Resource Strain: Low Risk  (07/29/2022)   [facility] prior to 12/13/20[facility] visits prior to 12/13/2022.   Overall Financial Resource Strain (CARDIA)    Difficulty of Paying Living  Expenses: Not hard at all  Food Insecurity: Low Risk  (07/30/2023)   Received from [facility]   Hunger Vital Sign    Worried About Running Out of Food in the Last Year: Never true    Ran Out of Food in the Last Year: Never true  Transportation Needs: No Transportation Needs (07/30/2023)   Received from [facility]    In the past 12 months, has lack of reliable transportation kept you from medical appointments, meetings, work or from getting things needed for daily living? : No  Physical Activity: Insufficiently Active (07/29/2022)   [facility] from [facility] visits prior to 12/13/2022., [facility] visits prior to 12/13/2022.   Exercise Vital Sign    Days of Exercise per Week: 2 days    Minutes of Exercise per Session: 60 min  Stress: No Stress Concern Present (07/29/2022)   [facility] prior to 12/13/2022., [facility] visits prior to 12/13/2022.   [employer] of Occupational Health - Occupational Stress Questionnaire    Feeling of Stress : Not at all  Social Connections: Moderately Integrated (07/29/2022)   [facility] prior to 12/13/2022., [facility] visits prior to 12/13/2022.   Social Connection and Isolation Panel [NHANES]    Frequency of Communication with Friends and Family: Three times a week    Frequency of Social Gatherings with Friends and Family: Once a week    Attends Religious Services: 1 to 4 times per year    Active Member of [employer] or Organizations: No    Attends Banker Meetings: Patient declined    Marital Status: Married    Allergies  Allergen Reactions   Azithromycin  Tinitus  Hearing loss and tinnitus   Augmentin [Amoxicillin-Pot Clavulanate] Cough    Redness on face  chest and arms    No Known Allergies      Current Outpatient Medications:    atorvastatin  (LIPITOR) 20 MG tablet, Take 20 mg by mouth daily at 6 PM., Disp: , Rfl:    Calcium -Vitamin D-Vitamin K 500-100-40 MG-UNT-MCG CHEW, Chew 2 tablets by mouth daily., Disp: , Rfl:    cholecalciferol (VITAMIN D) 1000 UNITS tablet, Take 1,000 Units by mouth daily., Disp: , Rfl:    clofazimine  50 mg CAPS capsule (for compassionate use), Take 2 capsules (100 mg total) by mouth daily with breakfast., Disp: 200 capsule, Rfl: 0   cycloSPORINE  (RESTASIS ) 0.05 % ophthalmic emulsion, Place 1 drop into both eyes 2 (two) times daily., Disp: , Rfl:    folic acid (FOLVITE) 1 MG tablet, Take 1 mg by mouth 2 (two) times daily. , Disp: , Rfl:    gabapentin (NEURONTIN) 300 MG capsule, Take 300-600 mg by mouth at bedtime., Disp: , Rfl:    Krill Oil Omega-3 300 MG CAPS, Take 1 capsule by mouth daily., Disp: , Rfl:    levothyroxine  (SYNTHROID , LEVOTHROID) 112 MCG tablet, Take 112 mcg by mouth daily before breakfast., Disp: , Rfl:    losartan -hydrochlorothiazide  (HYZAAR) 100-12.5 MG per tablet, Take 1 tablet by mouth daily., Disp: , Rfl:    Lysine 1000 MG TABS, Take 1,000 mg by mouth 2 (two) times daily., Disp: , Rfl:    methocarbamol  (ROBAXIN ) 500 MG tablet, Take 1 tablet (500 mg total) by mouth every 6 (six) hours as needed for muscle spasms., Disp: 60 tablet, Rfl: 0   Multiple Vitamin (MULITIVITAMIN WITH MINERALS) TABS, Take 1 tablet by mouth daily., Disp: , Rfl:    Probiotic Product (PROBIOTIC PO), Take 1 tablet by mouth daily., Disp: , Rfl:    traMADol (ULTRAM) 50 MG tablet, Take 50 mg by mouth 3 (three) times daily as needed., Disp: , Rfl:    valACYclovir (VALTREX) 500 MG tablet, Take 2,000 mg by mouth 2 (two) times daily as needed (cold sores). , Disp: , Rfl:    zolpidem  (AMBIEN ) 10 MG tablet, Take 2.5 mg by mouth at bedtime as needed for sleep., Disp: , Rfl:    Review of Systems  Constitutional:  Negative for activity  change, appetite change, chills, diaphoresis, fatigue, fever and unexpected weight change.  HENT:  Negative for congestion, rhinorrhea, sinus pressure, sneezing, sore throat and trouble swallowing.   Eyes:  Negative for photophobia and visual disturbance.  Respiratory:  Negative for cough, chest tightness, shortness of breath, wheezing and stridor.   Cardiovascular:  Negative for chest pain, palpitations and leg swelling.  Gastrointestinal:  Negative for abdominal distention, abdominal pain, anal bleeding, blood in stool, constipation, diarrhea, nausea and vomiting.  Genitourinary:  Negative for difficulty urinating, dysuria, flank pain and hematuria.  Musculoskeletal:  Negative for arthralgias, back pain, gait problem, joint swelling and myalgias.  Skin:  Negative for color change, pallor, rash and wound.  Neurological:  Negative for dizziness, tremors, weakness and light-headedness.  Hematological:  Negative for adenopathy. Does not bruise/bleed easily.  Psychiatric/Behavioral:  Negative for agitation, behavioral problems, confusion, decreased concentration, dysphoric mood and sleep disturbance.        Objective:   Physical Exam Exam conducted with a chaperone present.  Constitutional:      General: She is not in acute distress.    Appearance: Normal appearance. She is well-developed. She is not ill-appearing or diaphoretic.  HENT:     Head: Normocephalic and atraumatic.     Right Ear: Hearing and external ear normal.     Left Ear: Hearing and external ear normal.     Nose: No nasal deformity or rhinorrhea.  Eyes:     General: No scleral icterus.    Conjunctiva/sclera: Conjunctivae normal.     Right eye: Right conjunctiva is not injected.     Left eye: Left conjunctiva is not injected.     Pupils: Pupils are equal, round, and reactive to light.  Neck:     Vascular: No JVD.  Cardiovascular:     Rate and Rhythm: Normal rate and regular rhythm.     Heart sounds: Normal heart  sounds, S1 normal and S2 normal. No murmur heard.    No friction rub.  Abdominal:     General: Bowel sounds are normal. There is no distension.     Palpations: Abdomen is soft.     Tenderness: There is no abdominal tenderness.  Musculoskeletal:        General: Normal range of motion.     Right shoulder: Normal.     Left shoulder: Normal.     Cervical back: Normal range of motion and neck supple.     Right hip: Normal.     Left hip: Normal.     Right knee: Normal.     Left knee: Normal.  Lymphadenopathy:     Head:     Right side of head: No submandibular, preauricular or posterior auricular adenopathy.     Left side of head: No submandibular, preauricular or posterior auricular adenopathy.     Cervical: No cervical adenopathy.     Right cervical: No superficial or deep cervical adenopathy.    Left cervical: No superficial or deep cervical adenopathy.  Skin:    General: Skin is warm and dry.     Coloration: Skin is not pale.     Findings: No abrasion, bruising, ecchymosis, erythema, lesion or rash.     Nails: There is no clubbing.  Neurological:     Mental Status: She is alert and oriented to person, place, and time.     Sensory: No sensory deficit.     Coordination: Coordination normal.     Gait: Gait normal.  Psychiatric:        Attention and Perception: She is attentive.        Mood and Affect: Mood normal.        Speech: Speech normal.        Behavior: Behavior normal. Behavior is cooperative.        Thought Content: Thought content normal.        Judgment: Judgment normal.        08/13/2023 Right leg      08/13/2023 Right leg   Left leg     Left leg near thigh,inguinal area     Right arm      09/07/2023:  Arm     Right leg       Left leg     Left thigh    Fingernail lesion     Right arm 10/28/23:    Right leg 10/28/23:          Left leg 10/28/23:     Right leg 02/01/2024  one lesion tha has not  responded as well          Left lower leg 02/01/2024    Right arm     Lesion that she  is seeing Dermatology for  02/01/2024:       Assessment and Plan    M chelonae:   Lesions improved with azithromycin  and clofazimine , except one persistent lesion. Treatment effective, well-tolerated.--unless she is developing hearing loss.   - Continue azithromycin  and clofazimine  likely thru a year. - Schedule follow-up in 4 months to reassess treatment efficacy.  Suspicious skin lesion New skin lesion appeared 1-2 months ago, initially thought to be a wart. Dermatology evaluation pending. - Attend dermatology appointment in four weeks for evaluation of the new skin lesion.  Rheumatoid arthritis Off methotrexate since April 2024. Occasional ankle swelling. Caution with immunosuppressive therapy due to risk of exacerbating skin infection. - Avoid immunosuppressive therapy until skin infection is fully resolved.  Hearing changes Reports increased TV volume need, audiology assessments stable. She suspects TV issue. - Follow up with audiologist

## 2024-02-01 ENCOUNTER — Ambulatory Visit (INDEPENDENT_AMBULATORY_CARE_PROVIDER_SITE_OTHER): Payer: Medicare Other | Admitting: Infectious Disease

## 2024-02-01 ENCOUNTER — Encounter: Payer: Self-pay | Admitting: Infectious Disease

## 2024-02-01 ENCOUNTER — Other Ambulatory Visit: Payer: Self-pay

## 2024-02-01 VITALS — BP 169/83 | HR 89 | Resp 16 | Ht 64.0 in | Wt 145.0 lb

## 2024-02-01 DIAGNOSIS — A318 Other mycobacterial infections: Secondary | ICD-10-CM

## 2024-02-01 DIAGNOSIS — H9193 Unspecified hearing loss, bilateral: Secondary | ICD-10-CM

## 2024-02-01 DIAGNOSIS — H9313 Tinnitus, bilateral: Secondary | ICD-10-CM | POA: Diagnosis not present

## 2024-02-01 DIAGNOSIS — M057A Rheumatoid arthritis with rheumatoid factor of other specified site without organ or systems involvement: Secondary | ICD-10-CM

## 2024-02-01 DIAGNOSIS — C44722 Squamous cell carcinoma of skin of right lower limb, including hip: Secondary | ICD-10-CM | POA: Diagnosis not present

## 2024-02-01 MED ORDER — AZITHROMYCIN 250 MG PO TABS: 250.0000 mg | ORAL_TABLET | Freq: Every day | ORAL | 7 refills | Status: DC

## 2024-02-16 ENCOUNTER — Ambulatory Visit: Attending: Infectious Disease | Admitting: Audiologist

## 2024-02-16 ENCOUNTER — Telehealth: Payer: Self-pay | Admitting: Pharmacist

## 2024-02-16 DIAGNOSIS — H903 Sensorineural hearing loss, bilateral: Secondary | ICD-10-CM | POA: Insufficient documentation

## 2024-02-16 DIAGNOSIS — H938X3 Other specified disorders of ear, bilateral: Secondary | ICD-10-CM | POA: Insufficient documentation

## 2024-02-16 NOTE — Procedures (Signed)
  Outpatient Audiology and [facility] [address] [place], [place]  [address] [phone]  Ototoxic Monitoring Baseline Audiologic Evaluation   NAME: Allison Randall     DOB:   21-Nov-1950      MRN: [id number]                                                                                     DATE: 02/16/2024     REFERENT: [name] STATUS: Outpatient DIAGNOSIS: Audiologic Evaluation for the Purpose of Ototoxic Monitoring   History: Allison Randall was seen for an audiological evaluation.  Allison Randall is receiving a hearing evaluation to monitor hearing thresholds before during treatment with ototoxic mediation. Allison Randall says the TV is more difficult to hear. She has been congested recently.  Evaluation:  Otoscopy showed a clear view of the tympanic membranes, bilaterally Tympanometry results were consistent with normal middle ear function , bilaterally  Audiometric testing was completed using conventional and high frequency audiometry with supraural transducer.  Pure tone thresholds show a shift in hearing in the left ear from 250-14kHz. Right ear still stable with no significant changes.   Results:  The test results were reviewed with Allison Randall. Left ear has changed with a 10-20dB decrease in hearing across the frequency range. Recommend testing again in four weeks to see if change in hearing is stable. Results will be sent to referring provider that is managing ototoxic medication.   Recommendations: 1.   Return for audiological evaluation June 16th 2025 before next follow up with Dr. [name].    18 minutes spent testing and counseling on results.    Raynald Calkins  Audiologist, Au.D., CCC-A 02/16/2024  11:31 AM  Cc:  [name]

## 2024-02-16 NOTE — Telephone Encounter (Signed)
 Patient picked up 2 bottles of clofazimine  on 02/16/24. Supply should last for ~3 months. Next refill due around the beginning/middle of August.  [name], PharmD, BCIDP, AAHIVP, CPP Clinical Pharmacist Practitioner Infectious Diseases Clinical Pharmacist [facility] 02/16/2024, 10:21 AM

## 2024-03-11 ENCOUNTER — Encounter: Payer: Self-pay | Admitting: Infectious Disease

## 2024-03-15 ENCOUNTER — Ambulatory Visit: Admitting: Audiologist

## 2024-03-28 ENCOUNTER — Other Ambulatory Visit: Payer: Self-pay

## 2024-03-28 ENCOUNTER — Ambulatory Visit: Attending: Infectious Disease | Admitting: Audiologist

## 2024-03-28 DIAGNOSIS — H9190 Unspecified hearing loss, unspecified ear: Secondary | ICD-10-CM

## 2024-03-28 DIAGNOSIS — H903 Sensorineural hearing loss, bilateral: Secondary | ICD-10-CM | POA: Diagnosis present

## 2024-03-28 DIAGNOSIS — H938X3 Other specified disorders of ear, bilateral: Secondary | ICD-10-CM | POA: Diagnosis present

## 2024-03-28 DIAGNOSIS — H9319 Tinnitus, unspecified ear: Secondary | ICD-10-CM

## 2024-03-28 NOTE — Procedures (Signed)
  Outpatient Audiology and [facility] [address] [place], [place]  [address] [phone]  AUDIOLOGICAL  EVALUATION  NAME: Allison Randall     DOB:   1950/12/01      MRN: [id number]                                                                                     DATE: 03/28/2024     REFERENT: Tura Gaines, MD STATUS: Outpatient DIAGNOSIS: Ototoxic Monitoring    History: Allison Randall was seen for an audiological evaluation for ototoxicity monitoring.  02/16/2024 Allison Randall had a significant change in her left ear hearing. Today's appointment scheduled to    Evaluation:  Otoscopy showed a clear view of the tympanic membranes, bilaterally Tympanometry results were consistent with normal middle ear function, bilaterally   Audiometric testing was completed using Conventional Audiometry techniques with supraural headphones. Test results are consistent with stable hearing bilaterally 250-12.5kHz.   Results:  The test results were reviewed with Allison Randall. No indication of hearing loss progression in either ear. Left ear hearing is stable compared to May 6th thresholds.  Recommendations: Hearing is stable compared to previous screening, no worsening of asymmetry.  Talk with Dr. [name] about plan of care Follow up in six weeks to continue monitoring for change, Allison Randall can always call and be scheduled sooner if she perceives change.    18 minutes spent testing and counseling on results.   If you have any questions please feel free to contact me at [phone].  Raynald Calkins Stalnaker Au.D.  Audiologist   03/28/2024  11:29 AM  Cc: Tura Gaines, MD

## 2024-05-09 ENCOUNTER — Ambulatory Visit: Attending: Infectious Disease | Admitting: Audiologist

## 2024-05-09 DIAGNOSIS — H903 Sensorineural hearing loss, bilateral: Secondary | ICD-10-CM | POA: Diagnosis present

## 2024-05-09 DIAGNOSIS — H938X3 Other specified disorders of ear, bilateral: Secondary | ICD-10-CM | POA: Diagnosis present

## 2024-05-09 NOTE — Procedures (Signed)
  Outpatient Audiology and [facility] [address] [place], [place]  [address] [phone]  AUDIOLOGICAL  EVALUATION  NAME: Allison Randall     DOB:   28-May-1951      MRN: [id number]                                                                                     DATE: 05/09/2024     REFERENT: Tanda Prentice DEL, MD STATUS: Outpatient DIAGNOSIS: Ototoxic Monitoring    History: [name] was seen for an audiological evaluation for ototoxicity monitoring.  02/16/2024 [name] had a significant change in her left ear hearing. [name] denies any further perception of change since that time. Last evaluation 03/28/24 [name] had no progression of loss.    Evaluation:  Otoscopy showed a clear view of the tympanic membranes, bilaterally Audiometric testing was completed using Conventional Audiometry techniques with supraural headphones. Test results are consistent with stable hearing bilaterally 250-12.5kHz.   Results:  The test results were reviewed with [name]. Hearing is stable. She can wait and come back in 6 months due to persistently stable hearing. She is very concerned about the possibility of increased hearing loss. Follow up scheduled sooner.  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 Since is stable she can try hearing aids for both ears. [name] denied any difficulty hearing and did not want to discuss hearing aids at this time.   Recommendations: Follow up scheduled 06/21/2024  21 minutes spent testing and counseling on results.   If you have any questions please feel free to contact me at [phone].  [name] Stalnaker Au.D.  Audiologist   05/09/2024  12:40 PM  Cc: Tanda Prentice DEL, MD

## 2024-06-05 NOTE — Progress Notes (Unsigned)
 Subjective:   Chief complaint: follow-up for M chelonae infection    Patient ID: Allison Randall, female    DOB: 1951/06/09, 73 y.o.   MRN: [id number]  HPI  Allison Randall is a 73 year-old lady who has a past medical history significant for rheumatoid arthritis, hyperlipidemia HSV infection, who had been getting regular treatment with saline injections to treat varicose veins who developed mycobacterium chelonae infection  Interim history:    The past these had gone without complication but however this past February she says that the typical erythematous areas in her legs that ensue after injection of the saline failed to resolve and several areas on her upper legs in particular became more erythematous and more inflamed.  She was given several rounds of antibiotics and seen by dermatology who ultimately performed a biopsy which came back with granulomatous pathology as well as evidence of HSV faction with inclusion bodies present and stains positive.  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 Robinson.  M chelonae I susceptibilities are shown before and pictured.    Interim history:  The only reasonable options I saw at that time for her would be oral azithromycin  500 mg along with clofazimine  2 tablets once daily.  She started clofazimine  and azithromycin .  She seem to be doing pretty well.  Then in June 2022 she went to a very loud country music concert and afterwards noticed that she had loss of hearing and also tinnitus this persisted and continues to the present day and she is visibly hard of hearing in the clinic.  She had looked up possibility of azithromycin  causing hearing loss and while rare it can indeed do so.  I had heard of this I had counseled her to stop the antibiotics but she wanted to continue at the time since there were not many antibiotic options.  UNFortunately as predicted  off antibiotics her Mycobacterium had begun to cause her problems again and lesions have grown and one has become purulent. She is for cataract surgery in October as well as come concerned about the fact that she still has soft tissue infection.  I assured her this should not affect her cataract surgery.  We were able to get her an infusion of IV Nuzyra , and now her Medicare drug plan will cover Nuzyra  though it is still leaving her with $3300 plus co-pay for the first 30 days of tablets.  We were able to get 15 days of tablets from the drug company an additional 15 days which we have given her.  Another option were contemplating was therapy with clofazimine  and bedalaquine but I worried about QT prolonging effects of both the clofazamine and bedalaquine.  She also has been on Zofran  and had nausea.  We obtained a twelve-lead EKG at last visit which showed a QT that was prolonged to 422 ms and a QTC of 462 ms with normal sinus rhythm and some preventricular ventricular contractions  Retruned  to clinic for follow-up ==and we obtained a repeat EKG which now shows a QT of 387 and a QTC of 426 ms with normal sinus rhythm.  She continued of improvement in the areas in her legs.  Her hearing loss completely returned to normal.  She then  developed several areas of hyperpigmentation in her legs including the calves thighs arms and upper chest.  She also has dry skin and intense pruritus.  Upon  examining them in   at prior visit they seem consistent with a tetracycline induced hyperpigmentation rash.  In March  wwe stopped her antimycobacterial therapy and have been observing her off therapy.   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  She continues to be followed with dermatology.  She then developed new lesions on her left lower leg, left upper thigh with largest and most tender lesion and lesion in that thigh but also a lesion in her right  arm.  Biopsies were performed and pathology from the left lower extremity revealed some mycobacteria within a granulomatous pathology.  The cultures apparently that were taken did not arrive at proper temperature for the organism to be cultured and so no AFB culture sensitivity was obtained there.  The patient tells me that her right upper arm lesion was biopsied roughly a week prior to her visit with me in May.  Those cultures have indeed yielded Mycobacterium chelonae and sensitivity data is still pending with the lab have been sent to [place] .    She has stopped her MTX.  I have referred her to Selinda Chester, MD at [facility] she has an appoint with him coming up at the end of July.  Interestingly since I last saw her several of these lesions have improved in their appearance despite her not having received therapy that would be directed against mycobacterial infection.  Is a new area on her arm where the biopsy was taken that is become more hyperpigmented    She saw Selinda Chester on 05/11/2023. He was concerned that the patient could have disseminated disease, given lesions in multiple locations including now her arms.  He felt first step was to get a  AFB blood culture to determine if she in fact has disseminated infection.  He also ebiopsied the leg so we can get an isolate on hand and send it for susceptibility testing to [place] . He was not optimistic about the prospects for phage therapy here but an isolate would be necessary if this were to be pursued.  He recommended in the interim applying heat therapy and he has apparently cured patients with heat therapy alone. He advised Ms. Delmundo to apply hand warmers to the affected sites for about an hour twice daily. I  In addition to that he recommended topical i imiquimod  therapy. He  recommended applying imiquimod  cream to each affected area  daily 5 days per week (Monday-Friday).   He recommended starting with  an 8-week course and  reassess. He also favored holding the methotrexate.   Her rheumatologist has given her a prescription for hydroxychloroquine and using that instead is fine by me; there are some QT issues that may complicate future antibiotic treatment but that can be managed with close monitoring.  Susceptibility testing from Dr Shawnie office showed usceptible to clarithromycin (MIC 0.25), tobramycin, and linezolid ; I to amikacin and doxycycline , R to cefoxitin, ciprofloxacin, moxifloxacin, and imipenem. Favorable MICs to clofazimine  (0.25) and tigecycline (0.12). He recommended that if the heat therapy/imiquimod  therapy did not work that a 2 drug regimen of zithromycin 250 mg daily plus linezolid  600 mg daily would be a reaonable approach.    [facility] lab running of her last visit in August and there has been no growth to date which 1 would expect to have already happened given that M chelonae is a rapid grower.  When applying the heat therapy twice a day when she can do sometimes it  is only once a day.  There had been some slight improvement in some of the lesions at last visit.  Since then she has been treated by a provider at [facility] for cellulitis several times with doxycycline  with improvement of this area on RLE  FORTUNATELY her blood culture at [facility] was negative and final for AFB.  Her soft tissue culture WAS + for M chelonae with sensitivities as below:    Topical Aldara  and heat therapy has not improved her lesions and they have actually progressed.  Will therefore initiate therapy at last visit of azithromycin  and linezolid , she then developed tinnitus and we switched to clofazamine and zyvox .  We last saw Allison Randall she she has been experiencing lossobtained in her mouth and dizziness that she attributes to the Zyvox .  She stopped this and continue clofazimine  alone which have asked her not to stop.  She is also experiencing worsening of her tinnitus at times though she is not on the  azithromycin .  She returned today to clinic for consideration of a different regimen.  Her organism was susceptible to Bactrim though our pharmacy team was not enthusiastic about the potency of his drug against mycobacterial infections.   Cefoxitin, Carbapenems and FQ were all RESISTANT  Tygacil was active as would be omadacycline .  On therapy her lesions had diminished in size.  We ended up opting for azithromycin  and clofazamine after discussion with Dr. Florian with [facility] [place] [facility] with Audiology BEFORE starting macrolide and monthly after starting it.  She has had NO change in her hearing on audiology or subjectively.  Since being on azithromycin  and clofazimine  she is experienced a change in her cutaneous lesions that have become less angry and weepy and are starting to regress already some.  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        Discussed the use of AI scribe software for clinical note transcription with the patient, who gave verbal consent to proceed.  History of Present Illness   Allison Randall is a 73 year old female who presents for follow-up on her antibiotic treatment for m chelonae  Almost all of her lesions have improved and are stable one lesion in particular that has not improved significantly. During a dermatology visit in July, the dermatologist suggested removing the lesion. She is uncertain about the removal and wanted to discuss it further.  She has been on a regimen of azithromycin  and clofazamine since December 10th, 2024. Despite eight months of treatment, she feels the lesions should have improved more by now. S. She mentions needing a referral for her upcoming hearing test.  She has been filling her  azithromycin  prescription at her pharmacy and has several refills remaining. She receives clofazamine from the clinic and has four weeks left of her current supply.            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    Social History   Socioeconomic History   Marital status: Married    Spouse name: Not on file   Number of children: Not on file   Years of education: Not on file   Highest education level: Not on file  Occupational History   Not on file  Tobacco Use   Smoking status: Former    Current packs/day: 0.00    Types: Cigarettes    Quit date: 11/30/2007    Years since quitting: 16.5   Smokeless tobacco: Never  Substance and Sexual Activity   Alcohol use: No   Drug use: No   Sexual activity: Not on file  Other Topics Concern   Not on file  Social History Narrative   Not on file   Social Drivers of Health   Financial Resource Strain: Low Risk  (07/29/2022)   Received from [facility] visits prior to 12/13/2022., [facility]   Overall Financial Resource Strain (CARDIA)    Difficulty of Paying Living Expenses: Not hard at all  Food Insecurity: Low Risk  (07/30/2023)   Received from [facility]   Hunger  Vital Sign    Within the past 12 months, you worried that your food would run out before you got money to buy more: Never true    Within the past 12 months, the food you bought just didn't last and you didn't have money to get more. : Never true  Transportation Needs: No Transportation Needs (07/30/2023)   Received from [facility]    In the past 12 months, has lack of reliable transportation kept you from medical appointments, meetings, work or from getting things needed for daily living? : No  Physical Activity: Insufficiently Active (07/29/2022)   Received from [facility] visits prior to 12/13/2022., [facility]   Exercise Vital Sign    On average, how many days per week do you engage in moderate to strenuous exercise (like a brisk walk)?: 2 days    On average, how many minutes do you engage in exercise at this level?: 60 min  Stress: No Stress Concern Present (07/29/2022)   Received from [facility] visits prior to 12/13/2022., [facility]   [employer] of Occupational Health - Occupational Stress Questionnaire    Feeling of Stress : Not at all  Social Connections: Moderately Integrated (07/29/2022)   Received from [facility] visits prior to 12/13/2022., [facility]   Social Connection and Isolation Panel    In a typical week, how many times do you talk on the phone with family, friends, or neighbors?: Three times a week    How often do you get together with friends or relatives?: Once a week    How often do you attend church or religious services?: 1 to 4 times per year    Do you belong to any clubs or organizations such as church groups, unions, fraternal or athletic groups, or school groups?: No    How often do  you attend meetings of the clubs or organizations you belong to?: Patient declined    Are you married, widowed, divorced, separated, never married, or living with a partner?: Married     Allergies  Allergen Reactions   Azithromycin  Tinitus    Hearing loss and tinnitus   Augmentin [Amoxicillin-Pot Clavulanate] Cough    Redness on face chest and arms    No Known Allergies      Current Outpatient Medications:    atorvastatin  (LIPITOR) 20 MG tablet, Take 20 mg by mouth daily at 6 PM., Disp: , Rfl:    azithromycin  (ZITHROMAX ) 250 MG tablet, Take 1 tablet (250 mg total) by mouth daily., Disp: 30 each, Rfl: 7   Calcium -Vitamin D-Vitamin K 500-100-40 MG-UNT-MCG CHEW, Chew 2 tablets by mouth daily., Disp: , Rfl:    cholecalciferol (VITAMIN D) 1000 UNITS tablet, Take 1,000 Units by mouth daily., Disp: , Rfl:    clofazimine  50 mg CAPS capsule (for compassionate use), Take 2 capsules (100 mg total) by mouth daily with breakfast., Disp: 200 capsule, Rfl: 0   cycloSPORINE  (RESTASIS ) 0.05 % ophthalmic emulsion, Place 1 drop into both eyes 2 (two) times daily., Disp: , Rfl:    folic acid (FOLVITE) 1 MG tablet, Take 1 mg by mouth 2 (two) times daily. , Disp: , Rfl:    gabapentin (NEURONTIN) 300 MG capsule, Take 300-600 mg by mouth at bedtime., Disp: , Rfl:    Krill Oil Omega-3 300 MG CAPS, Take 1 capsule by mouth daily., Disp: , Rfl:    levothyroxine  (SYNTHROID , LEVOTHROID) 112 MCG tablet, Take 112 mcg by mouth daily before breakfast., Disp: , Rfl:    losartan -hydrochlorothiazide  (HYZAAR) 100-12.5 MG per tablet, Take 1 tablet by mouth daily., Disp: , Rfl:    Lysine 1000 MG TABS, Take 1,000 mg by mouth 2 (two) times daily., Disp: , Rfl:    methocarbamol  (ROBAXIN ) 500 MG tablet, Take 1 tablet (500 mg total) by mouth every 6 (six) hours as needed for muscle spasms., Disp: 60 tablet, Rfl: 0   Multiple Vitamin (MULITIVITAMIN WITH MINERALS) TABS, Take 1 tablet by mouth daily., Disp: , Rfl:    Probiotic Product (PROBIOTIC PO), Take 1 tablet by mouth daily., Disp: , Rfl:    traMADol (ULTRAM) 50 MG tablet, Take 50 mg by mouth 3 (three) times daily as needed., Disp: , Rfl:    valACYclovir  (VALTREX) 500 MG tablet, Take 2,000 mg by mouth 2 (two) times daily as needed (cold sores). , Disp: , Rfl:    zolpidem  (AMBIEN ) 10 MG tablet, Take 2.5 mg by mouth at bedtime as needed for sleep., Disp: , Rfl:    Review of Systems  Constitutional:  Negative for activity change, appetite change, chills, diaphoresis, fatigue, fever and unexpected weight change.  HENT:  Negative for congestion, rhinorrhea, sinus pressure, sneezing, sore throat and trouble swallowing.   Eyes:  Negative for photophobia and visual disturbance.  Respiratory:  Negative for cough, chest tightness, shortness of breath, wheezing and stridor.   Cardiovascular:  Negative for chest pain, palpitations and leg swelling.  Gastrointestinal:  Negative for abdominal distention, abdominal pain, anal bleeding, blood in stool, constipation, diarrhea, nausea and vomiting.  Genitourinary:  Negative for difficulty urinating, dysuria, flank pain and hematuria.  Musculoskeletal:  Negative for arthralgias, back pain, gait problem, joint swelling and myalgias.  Skin:  Negative for color change, pallor, rash and wound.  Neurological:  Negative for dizziness, tremors, weakness and light-headedness.  Hematological:  Negative for adenopathy. Does not bruise/bleed  easily.  Psychiatric/Behavioral:  Negative for agitation, behavioral problems, confusion, decreased concentration, dysphoric mood and sleep disturbance.        Objective:   Physical Exam Constitutional:      General: She is not in acute distress.    Appearance: Normal appearance. She is well-developed. She is not ill-appearing or diaphoretic.  HENT:     Head: Normocephalic and atraumatic.     Right Ear: Hearing and external ear normal.     Left Ear: Hearing and external ear normal.     Nose: No nasal deformity or rhinorrhea.  Eyes:     General: No scleral icterus.    Conjunctiva/sclera: Conjunctivae normal.     Right eye: Right conjunctiva is not injected.     Left eye: Left  conjunctiva is not injected.     Pupils: Pupils are equal, round, and reactive to light.  Neck:     Vascular: No JVD.  Cardiovascular:     Rate and Rhythm: Normal rate and regular rhythm.     Heart sounds: S1 normal and S2 normal.  Pulmonary:     Effort: Pulmonary effort is normal. No respiratory distress.     Breath sounds: No wheezing.  Abdominal:     General: There is no distension.     Palpations: Abdomen is soft.  Musculoskeletal:        General: Normal range of motion.     Right shoulder: Normal.     Left shoulder: Normal.     Cervical back: Normal range of motion and neck supple.     Right hip: Normal.     Left hip: Normal.     Right knee: Normal.     Left knee: Normal.  Lymphadenopathy:     Head:     Right side of head: No submandibular, preauricular or posterior auricular adenopathy.     Left side of head: No submandibular, preauricular or posterior auricular adenopathy.     Cervical: No cervical adenopathy.     Right cervical: No superficial or deep cervical adenopathy.    Left cervical: No superficial or deep cervical adenopathy.  Skin:    General: Skin is warm and dry.     Coloration: Skin is not pale.     Findings: No abrasion, bruising, ecchymosis, erythema, lesion or rash.     Nails: There is no clubbing.  Neurological:     General: No focal deficit present.     Mental Status: She is alert and oriented to person, place, and time.     Sensory: No sensory deficit.     Coordination: Coordination normal.     Gait: Gait normal.  Psychiatric:        Attention and Perception: She is attentive.        Mood and Affect: Mood normal.        Speech: Speech normal.        Behavior: Behavior normal. Behavior is cooperative.        Thought Content: Thought content normal.        Judgment: Judgment normal.        08/13/2023 Right leg      08/13/2023 Right leg   Left leg     Left leg near thigh,inguinal area     Right  arm      09/07/2023:  Arm     Right leg       Left leg     Left thigh    Fingernail lesion  Right arm 10/28/23:    Right leg 10/28/23:          Left leg 10/28/23:     Right leg 02/01/2024  one lesion tha has not responded as well          Left lower leg 02/01/2024    Right arm    Right leg where Dermatologist is considering removal of lesion 06/06/24    Right arm 06/06/24     Left leg 06/06/24      Left inner thigh 06/06/24:        Assessment and Plan    Mycobacterial chelonae infection: --I am fine with her Dermatologist removing this one lesion that may itself not be due to mycbacterium since it should have been improving - Continue azithromycin  and clofazomine until December.   Concern for macrolide toxicity to ears: - Provide referral for hearing test.   RA: not on potent immunologics at this point

## 2024-06-06 ENCOUNTER — Telehealth: Payer: Self-pay | Admitting: Pharmacist

## 2024-06-06 ENCOUNTER — Other Ambulatory Visit: Payer: Self-pay

## 2024-06-06 ENCOUNTER — Ambulatory Visit (INDEPENDENT_AMBULATORY_CARE_PROVIDER_SITE_OTHER): Admitting: Infectious Disease

## 2024-06-06 VITALS — BP 154/76 | HR 93 | Temp 97.8°F | Wt 146.0 lb

## 2024-06-06 DIAGNOSIS — H905 Unspecified sensorineural hearing loss: Secondary | ICD-10-CM

## 2024-06-06 DIAGNOSIS — M057A Rheumatoid arthritis with rheumatoid factor of other specified site without organ or systems involvement: Secondary | ICD-10-CM | POA: Diagnosis not present

## 2024-06-06 DIAGNOSIS — A318 Other mycobacterial infections: Secondary | ICD-10-CM | POA: Diagnosis present

## 2024-06-06 DIAGNOSIS — L989 Disorder of the skin and subcutaneous tissue, unspecified: Secondary | ICD-10-CM | POA: Diagnosis not present

## 2024-06-06 MED ORDER — AZITHROMYCIN 250 MG PO TABS: 250.0000 mg | ORAL_TABLET | Freq: Every day | ORAL | 4 refills | Status: AC

## 2024-06-06 NOTE — Telephone Encounter (Signed)
 Patient picked up 2 bottles of clofazimine  today. Supply should last for ~3 months. Next refill due around the middle of November.  Shayley Medlin L. Safi Culotta, PharmD, BCIDP, AAHIVP, CPP Clinical Pharmacist Practitioner Infectious Diseases Clinical Pharmacist [facility] 06/06/2024, 10:58 AM

## 2024-06-21 ENCOUNTER — Ambulatory Visit: Attending: Infectious Disease | Admitting: Audiologist

## 2024-06-21 DIAGNOSIS — H903 Sensorineural hearing loss, bilateral: Secondary | ICD-10-CM | POA: Diagnosis present

## 2024-06-21 NOTE — Procedures (Signed)
  Outpatient Audiology and [facility] [address] [place], [place]  [address] [phone]  AUDIOLOGICAL  EVALUATION  NAME: Allison Randall     DOB:   20-Aug-1951      MRN: [id number]                                                                                     DATE: 06/21/2024     REFERENT: Tanda Prentice DEL, MD STATUS: Outpatient DIAGNOSIS:  Ototoxic Monitoring     History: Allison Randall was seen for an audiological evaluation for ototoxicity monitoring.  02/16/2024 Allison Randall had a significant change in her left ear hearing. Allison Randall denies any further perception of change since that time. Last evaluation 05/09/2024  Allison Randall had no progression of loss.      Evaluation:  Otoscopy showed a clear view of the tympanic membranes, bilaterally Audiometric testing was completed using Conventional Audiometry techniques with supraural headphones. Test results are consistent with stable hearing bilaterally 250-8kHz.    Results:  The test results were reviewed with Allison Randall. Hearing is stable.    Recommendations: Follow up scheduled 09/14/24    18 minutes spent testing and counseling on results.   If you have any questions please feel free to contact me at [phone].  [name] Stalnaker Au.D.  Audiologist   06/21/2024  10:58 AM  Cc: Tanda Prentice DEL, MD

## 2024-08-22 LAB — COLOGUARD: COLOGUARD: POSITIVE — AB

## 2024-09-14 ENCOUNTER — Ambulatory Visit: Attending: Infectious Disease | Admitting: Audiologist

## 2024-09-14 ENCOUNTER — Encounter: Payer: Self-pay | Admitting: Family Medicine

## 2024-09-14 DIAGNOSIS — H903 Sensorineural hearing loss, bilateral: Secondary | ICD-10-CM | POA: Insufficient documentation

## 2024-09-14 DIAGNOSIS — H938X3 Other specified disorders of ear, bilateral: Secondary | ICD-10-CM | POA: Insufficient documentation

## 2024-09-14 NOTE — Procedures (Signed)
  Outpatient Audiology and [facility] [address] [place], [place]  [address] [phone]  AUDIOLOGICAL  EVALUATION  NAME: [name]     DOB:   04-Jul-1951      MRN: [id number]                                                                                     DATE: 09/14/2024     REFERENT: Tanda Prentice DEL, MD STATUS: Outpatient DIAGNOSIS: Ototoxic Monitoring, Sensorineural Hearing Loss    History: Bobbi was seen for an audiological evaluation for ototoxicity monitoring.  02/16/2024. Vennesa had a significant change in her left ear hearing. Lurae denies any further perception of change since that time. Last evaluation 06/21/2024.  Joelly has had no progression of loss.      Evaluation:  Otoscopy showed a clear view of the tympanic membranes, bilaterally Audiometric testing was completed using Conventional Audiometry techniques with supraural headphones. Test results are consistent with stable hearing bilaterally 250-8kHz.    Results:  The test results were reviewed with Kenise. Hearing is stable.    Recommendations: Follow up scheduled 02/01/25. Bertrice is expecting to maybe end treatment in May 2026. Full test to be performed at next evaluation.  14 minutes spent testing and counseling on results.   If you have any questions please feel free to contact me at [phone].  [name] Stalnaker Au.D.  Audiologist   09/14/2024  10:41 AM  Cc: Tanda Prentice DEL, MD

## 2024-09-26 NOTE — Progress Notes (Unsigned)
 Subjective:   Chief complaint: follow-up for M chelonae infection    Patient ID: Allison Randall, female    DOB: [birth year], 73 y.o.   MRN: [id number]  HPI  Allison Randall is a 73 year-old lady who has a past medical history significant for rheumatoid arthritis, hyperlipidemia HSV infection, who had been getting regular treatment with saline injections to treat varicose veins who developed mycobacterium chelonae infection  Interim history:    The past these had gone without complication but however this past February she says that the typical erythematous areas in her legs that ensue after injection of the saline failed to resolve and several areas on her upper legs in particular became more erythematous and more inflamed.  She was given several rounds of antibiotics and seen by dermatology who ultimately performed a biopsy which came back with granulomatous pathology as well as evidence of HSV faction with inclusion bodies present and stains positive.  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 Robinson.  M chelonae I susceptibilities are shown before and pictured.    Interim history:  The only reasonable options I saw at that time for her would be oral azithromycin  500 mg along with clofazimine  2 tablets once daily.  She started clofazimine  and azithromycin .  She seem to be doing pretty well.  Then in June 2022 she went to a very loud country music concert and afterwards noticed that she had loss of hearing and also tinnitus this persisted and continues to the present day and she is visibly hard of hearing in the clinic.  She had looked up possibility of azithromycin  causing hearing loss and while rare it can indeed do so.  I had heard of this I had counseled her to stop the antibiotics but she wanted to continue at the time since there were not many antibiotic options.  UNFortunately as predicted  off antibiotics her Mycobacterium had begun to cause her problems again and lesions have grown and one has become purulent. She is for cataract surgery in October as well as come concerned about the fact that she still has soft tissue infection.  I assured her this should not affect her cataract surgery.  We were able to get her an infusion of IV Nuzyra , and now her Medicare drug plan will cover Nuzyra  though it is still leaving her with $3300 plus co-pay for the first 30 days of tablets.  We were able to get 15 days of tablets from the drug company an additional 15 days which we have given her.  Another option were contemplating was therapy with clofazimine  and bedalaquine but I worried about QT prolonging effects of both the clofazamine and bedalaquine.  She also has been on Zofran  and had nausea.  We obtained a twelve-lead EKG at last visit which showed a QT that was prolonged to 422 ms and a QTC of 462 ms with normal sinus rhythm and some preventricular ventricular contractions  Retruned  to clinic for follow-up ==and we obtained a repeat EKG which now shows a QT of 387 and a QTC of 426 ms with normal sinus rhythm.  She continued of improvement in the areas in her legs.  Her hearing loss completely returned to normal.  She then  developed several areas of hyperpigmentation in her legs including the calves thighs arms and upper chest.  She also has dry skin and intense pruritus.  Upon  examining them in   at prior visit they seem consistent with a tetracycline induced hyperpigmentation rash.  In March  wwe stopped her antimycobacterial therapy and have been observing her off therapy.   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  She continues to be followed with dermatology.  She then developed new lesions on her left lower leg, left upper thigh with largest and most tender lesion and lesion in that thigh but also a lesion in her right  arm.  Biopsies were performed and pathology from the left lower extremity revealed some mycobacteria within a granulomatous pathology.  The cultures apparently that were taken did not arrive at proper temperature for the organism to be cultured and so no AFB culture sensitivity was obtained there.  The patient tells me that her right upper arm lesion was biopsied roughly a week prior to her visit with me in May.  Those cultures have indeed yielded Mycobacterium chelonae and sensitivity data is still pending with the lab have been sent to [place] .    She has stopped her MTX.  I have referred her to Selinda Chester, MD at [facility] she has an appoint with him coming up at the end of July.  Interestingly since I last saw her several of these lesions have improved in their appearance despite her not having received therapy that would be directed against mycobacterial infection.  Is a new area on her arm where the biopsy was taken that is become more hyperpigmented    She saw Selinda Chester on 05/11/2023. He was concerned that the patient could have disseminated disease, given lesions in multiple locations including now her arms.  He felt first step was to get a  AFB blood culture to determine if she in fact has disseminated infection.  He also ebiopsied the leg so we can get an isolate on hand and send it for susceptibility testing to [place] . He was not optimistic about the prospects for phage therapy here but an isolate would be necessary if this were to be pursued.  He recommended in the interim applying heat therapy and he has apparently cured patients with heat therapy alone. He advised Allison Randall to apply hand warmers to the affected sites for about an hour twice daily. I  In addition to that he recommended topical i imiquimod  therapy. He  recommended applying imiquimod  cream to each affected area  daily 5 days per week (Monday-Friday).   He recommended starting with  an 8-week course and  reassess. He also favored holding the methotrexate.   Her rheumatologist has given her a prescription for hydroxychloroquine and using that instead is fine by me; there are some QT issues that may complicate future antibiotic treatment but that can be managed with close monitoring.  Susceptibility testing from Dr Shawnie office showed usceptible to clarithromycin (MIC 0.25), tobramycin, and linezolid ; I to amikacin and doxycycline , R to cefoxitin, ciprofloxacin, moxifloxacin, and imipenem. Favorable MICs to clofazimine  (0.25) and tigecycline (0.12). He recommended that if the heat therapy/imiquimod  therapy did not work that a 2 drug regimen of zithromycin 250 mg daily plus linezolid  600 mg daily would be a reaonable approach.    [facility] lab running of her last visit in August and there has been no growth to date which 1 would expect to have already happened given that M chelonae is a rapid grower.  When applying the heat therapy twice a day when she can do sometimes it  is only once a day.  There had been some slight improvement in some of the lesions at last visit.  Since then she has been treated by a provider at [facility] for cellulitis several times with doxycycline  with improvement of this area on RLE  FORTUNATELY her blood culture at [facility] was negative and final for AFB.  Her soft tissue culture WAS + for M chelonae with sensitivities as below:    Topical Aldara  and heat therapy has not improved her lesions and they have actually progressed.  Will therefore initiate therapy at last visit of azithromycin  and linezolid , she then developed tinnitus and we switched to clofazamine and zyvox .  We last saw Tanekia she she has been experiencing lossobtained in her mouth and dizziness that she attributes to the Zyvox .  She stopped this and continue clofazimine  alone which have asked her not to stop.  She is also experiencing worsening of her tinnitus at times though she is not on the  azithromycin .  She returned today to clinic for consideration of a different regimen.  Her organism was susceptible to Bactrim though our pharmacy team was not enthusiastic about the potency of his drug against mycobacterial infections.   Cefoxitin, Carbapenems and FQ were all RESISTANT  Tygacil was active as would be omadacycline .  On therapy her lesions had diminished in size.  We ended up opting for azithromycin  and clofazamine after discussion with Dr. Florian with [facility] with Audiology BEFORE starting macrolide and monthly after starting it.  She has had NO change in her hearing on audiology or subjectively.  Since being on azithromycin  and clofazimine  she is experienced a change in her cutaneous lesions that have become less angry and weepy and are starting to regress already some.  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  Discussed the use of AI scribe software for clinical note transcription with the patient, who gave verbal consent to proceed.  History of Present Illness   Allison Randall is a 73 year old female with a mycobacterial infection who presents for follow-up regarding skin lesions and biopsy results.  She has been managing skin lesions attributed to a mycobacterial infection for over a year. Treatment has included azithromycin  and clofazimine  since April 2024. Recently, a dermatologist removed an area of concern in November 2025, and a biopsy was performed. The biopsy report indicated 'granular' tissue, but she has not yet discussed the results with the dermatologist.  The lesions have been improving, with all appearing smaller than before. She had stitches removed last week, from biopsy site but discovered an  additional stitch that was not initially removed, necessitating another visit to the dermatologist. The area where the lesion was removed is healing, though she notes that her skin takes a long time to fade after such procedures.  There is uncertainty about whether the biopsy was sent for culture, which is important to determine if the infection is still active.                  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    Social History   Socioeconomic History   Marital status: Married    Spouse name: Not on file   Number of children: Not on file   Years of education: Not on file   Highest education level: Not on file  Occupational History   Not on file  Tobacco Use   Smoking status: Former    Current packs/day: 0.00    Types: Cigarettes    Quit date: 11/30/2007    Years since quitting: 16.8   Smokeless tobacco: Never  Substance and Sexual Activity   Alcohol use: No   Drug use: No   Sexual activity: Not on file  Other Topics Concern   Not on file  Social History Narrative   Not on file   Social Drivers of Health   Tobacco Use: Medium Risk (08/10/2024)   Received from [facility]   Patient History    Smoking Tobacco Use:  Former    Smokeless Tobacco Use: Never    Passive Exposure: Not on file  Financial Resource Strain: Low Risk (07/29/2022)   Received from [facility]   Overall Financial Resource Strain (CARDIA)    Difficulty of Paying Living Expenses: Not hard at all  Food Insecurity: Low Risk (08/03/2024)   Received from [facility]   Epic    Within the past 12 months, you worried that your food would run out before you got money to buy more: Never true    Within the past 12 months, the food you bought just didn't last and you didn't have money to get more. : Never true  Transportation Needs: No Transportation Needs (08/03/2024)   Received from [facility]    In the past 12 months, has lack of reliable transportation kept you from medical appointments, meetings, work or from getting things needed for daily living? : No  Physical Activity: Insufficiently Active (07/29/2022)   Received from [facility] visits prior to 12/13/2022., [facility]   Exercise Vital Sign    On average, how many days per week do you engage in moderate to strenuous exercise (like a brisk walk)?: 2 days    On average, how many minutes do you engage in exercise at this level?: 60 min  Stress: No Stress Concern Present (07/29/2022)   Received from [facility] visits prior to 12/13/2022., [facility]   [employer] of Occupational Health - Occupational Stress Questionnaire    Feeling of Stress : Not at all  Social Connections: Moderately Integrated (07/29/2022)   Received from [facility] visits prior to 12/13/2022., [facility]   Social Connection and Isolation Panel    In a typical week, how many times do you talk on the phone with family, friends, or neighbors?: Three times a week    How often do you get together with  friends or relatives?: Once a week    How often do you attend church or religious services?: 1 to 4 times per year    Do  you belong to any clubs or organizations such as church groups, unions, fraternal or athletic groups, or school groups?: No    How often do you attend meetings of the clubs or organizations you belong to?: Patient declined    Are you married, widowed, divorced, separated, never married, or living with a partner?: Married  Depression (PHQ2-9): Low Risk (02/01/2024)   Depression (PHQ2-9)    PHQ-2 Score: 0  Alcohol Screen: Not on file  Housing: Low Risk (08/03/2024)   Received from [facility]   Epic    What is your living situation today?: I have a steady place to live    Think about the place you live. Do you have problems with any of the following? Choose all that apply:: None/None on this list  Utilities: Low Risk (08/03/2024)   Received from [facility]   Utilities    In the past 12 months has the electric, gas, oil, or water company threatened to shut off services in your home? : No  Health Literacy: Not on file    Allergies  Allergen Reactions   Azithromycin  Tinitus    Hearing loss and tinnitus   Augmentin [Amoxicillin-Pot Clavulanate] Cough    Redness on face chest and arms    No Known Allergies      Current Outpatient Medications:    atorvastatin  (LIPITOR) 20 MG tablet, Take 20 mg by mouth daily at 6 PM., Disp: , Rfl:    azithromycin  (ZITHROMAX ) 250 MG tablet, Take 1 tablet (250 mg total) by mouth daily., Disp: 30 each, Rfl: 4   Calcium -Vitamin D-Vitamin K 500-100-40 MG-UNT-MCG CHEW, Chew 2 tablets by mouth daily., Disp: , Rfl:    cholecalciferol (VITAMIN D) 1000 UNITS tablet, Take 1,000 Units by mouth daily., Disp: , Rfl:    clofazimine  50 mg CAPS capsule (for compassionate use), Take 2 capsules (100 mg total) by mouth daily with breakfast., Disp: 200 capsule, Rfl: 0   cycloSPORINE  (RESTASIS ) 0.05 % ophthalmic emulsion, Place 1 drop into both eyes 2 (two) times daily., Disp: , Rfl:    folic acid (FOLVITE) 1 MG tablet, Take 1 mg by mouth 2 (two) times daily. , Disp: ,  Rfl:    gabapentin (NEURONTIN) 300 MG capsule, Take 300-600 mg by mouth at bedtime., Disp: , Rfl:    Krill Oil Omega-3 300 MG CAPS, Take 1 capsule by mouth daily., Disp: , Rfl:    levothyroxine  (SYNTHROID , LEVOTHROID) 112 MCG tablet, Take 112 mcg by mouth daily before breakfast., Disp: , Rfl:    losartan -hydrochlorothiazide  (HYZAAR) 100-12.5 MG per tablet, Take 1 tablet by mouth daily., Disp: , Rfl:    Lysine 1000 MG TABS, Take 1,000 mg by mouth 2 (two) times daily., Disp: , Rfl:    methocarbamol  (ROBAXIN ) 500 MG tablet, Take 1 tablet (500 mg total) by mouth every 6 (six) hours as needed for muscle spasms., Disp: 60 tablet, Rfl: 0   Multiple Vitamin (MULITIVITAMIN WITH MINERALS) TABS, Take 1 tablet by mouth daily., Disp: , Rfl:    Probiotic Product (PROBIOTIC PO), Take 1 tablet by mouth daily., Disp: , Rfl:    traMADol (ULTRAM) 50 MG tablet, Take 50 mg by mouth 3 (three) times daily as needed., Disp: , Rfl:    valACYclovir (VALTREX) 500 MG tablet, Take 2,000 mg by mouth 2 (two) times daily as  needed (cold sores). , Disp: , Rfl:    zolpidem  (AMBIEN ) 10 MG tablet, Take 2.5 mg by mouth at bedtime as needed for sleep., Disp: , Rfl:    Review of Systems  Constitutional:  Negative for activity change, appetite change, chills, diaphoresis, fatigue, fever and unexpected weight change.  HENT:  Negative for congestion, rhinorrhea, sinus pressure, sneezing, sore throat and trouble swallowing.   Eyes:  Negative for photophobia and visual disturbance.  Respiratory:  Negative for cough, chest tightness, shortness of breath, wheezing and stridor.   Cardiovascular:  Negative for chest pain, palpitations and leg swelling.  Gastrointestinal:  Negative for abdominal distention, abdominal pain, anal bleeding, blood in stool, constipation, diarrhea, nausea and vomiting.  Genitourinary:  Negative for difficulty urinating, dysuria, flank pain and hematuria.  Musculoskeletal:  Negative for arthralgias, back pain, gait  problem, joint swelling and myalgias.  Skin:  Negative for color change, pallor, rash and wound.  Neurological:  Negative for dizziness, tremors, weakness and light-headedness.  Hematological:  Negative for adenopathy. Does not bruise/bleed easily.  Psychiatric/Behavioral:  Negative for agitation, behavioral problems, confusion, decreased concentration, dysphoric mood and sleep disturbance.        Objective:   Physical Exam Constitutional:      General: She is not in acute distress.    Appearance: Normal appearance. She is well-developed. She is not ill-appearing or diaphoretic.  HENT:     Head: Normocephalic and atraumatic.     Right Ear: Hearing and external ear normal.     Left Ear: Hearing and external ear normal.     Nose: No nasal deformity or rhinorrhea.  Eyes:     General: No scleral icterus.    Conjunctiva/sclera: Conjunctivae normal.     Right eye: Right conjunctiva is not injected.     Left eye: Left conjunctiva is not injected.     Pupils: Pupils are equal, round, and reactive to light.  Neck:     Vascular: No JVD.  Cardiovascular:     Rate and Rhythm: Normal rate and regular rhythm.     Heart sounds: Normal heart sounds, S1 normal and S2 normal. No murmur heard.    No friction rub.  Abdominal:     General: Bowel sounds are normal. There is no distension.     Palpations: Abdomen is soft.     Tenderness: There is no abdominal tenderness.  Musculoskeletal:        General: Normal range of motion.     Right shoulder: Normal.     Left shoulder: Normal.     Cervical back: Normal range of motion and neck supple.     Right hip: Normal.     Left hip: Normal.     Right knee: Normal.     Left knee: Normal.  Lymphadenopathy:     Head:     Right side of head: No submandibular, preauricular or posterior auricular adenopathy.     Left side of head: No submandibular, preauricular or posterior auricular adenopathy.     Cervical: No cervical adenopathy.     Right cervical:  No superficial or deep cervical adenopathy.    Left cervical: No superficial or deep cervical adenopathy.  Skin:    General: Skin is warm and dry.     Coloration: Skin is not pale.     Findings: No abrasion, bruising, ecchymosis, erythema, lesion or rash.     Nails: There is no clubbing.  Neurological:     Mental Status: She is alert and  oriented to person, place, and time.     Sensory: No sensory deficit.     Coordination: Coordination normal.     Gait: Gait normal.  Psychiatric:        Attention and Perception: She is attentive.        Speech: Speech normal.        Behavior: Behavior normal. Behavior is cooperative.        Thought Content: Thought content normal.        Judgment: Judgment normal.        08/13/2023 Right leg      08/13/2023 Right leg   Left leg     Left leg near thigh,inguinal area     Right arm      09/07/2023:  Arm     Right leg       Left leg     Left thigh    Fingernail lesion     Right arm 10/28/23:    Right leg 10/28/23:          Left leg 10/28/23:     Right leg 02/01/2024  one lesion tha has not responded as well          Left lower leg 02/01/2024    Right arm    Right leg where Dermatologist is considering removal of lesion 06/06/24    Right arm 06/06/24     Left leg 06/06/24      Left inner thigh 06/06/24:      Photo [id number]: right leg w biopsy site    09/27/24 Left leg lesion:      Right arm lesion                Assessment and Plan    Cutaneous Mycobacterium chelonae infection Chronic infection with improving lesions on azithromycin  and clofazimine . Pathology of excised lesion unclear. Awaiting  report of pathology and AFB culture results--if done - Contact dermatologist Dr. Robinson for pathology and culture results. --if path report/culture are reassuring that there is no active infection she SHOULD have had sufficient therapy at this  point with well over  a year of treatment   --if that is the case will stop her meds and have her followup with me in 3 months   RA: she has been off of immunosuppressive drugs, will need to monitor her skin closely if and when they are restarted     Hearing loss: stable and has been followed by audiology closely

## 2024-09-27 ENCOUNTER — Encounter: Payer: Self-pay | Admitting: Infectious Disease

## 2024-09-27 ENCOUNTER — Other Ambulatory Visit: Payer: Self-pay

## 2024-09-27 ENCOUNTER — Ambulatory Visit: Payer: Self-pay | Admitting: Infectious Disease

## 2024-09-27 ENCOUNTER — Telehealth: Payer: Self-pay

## 2024-09-27 VITALS — BP 142/81 | HR 87 | Temp 97.8°F | Ht 64.0 in | Wt 145.0 lb

## 2024-09-27 DIAGNOSIS — A318 Other mycobacterial infections: Secondary | ICD-10-CM

## 2024-09-27 DIAGNOSIS — H9193 Unspecified hearing loss, bilateral: Secondary | ICD-10-CM

## 2024-09-27 DIAGNOSIS — A311 Cutaneous mycobacterial infection: Secondary | ICD-10-CM | POA: Diagnosis present

## 2024-09-27 DIAGNOSIS — M057A Rheumatoid arthritis with rheumatoid factor of other specified site without organ or systems involvement: Secondary | ICD-10-CM

## 2024-09-27 DIAGNOSIS — H9313 Tinnitus, bilateral: Secondary | ICD-10-CM

## 2024-09-27 DIAGNOSIS — C44722 Squamous cell carcinoma of skin of right lower limb, including hip: Secondary | ICD-10-CM

## 2024-09-27 NOTE — Telephone Encounter (Signed)
 Left message on triage line at [facility] requesting Dr. Robinson call Dr. Fleeta Rothman directly to discuss path report from November.   Roanna Reaves, BSN, RN

## 2024-09-29 NOTE — Telephone Encounter (Signed)
 Advised patient of information and that Dr. Fleeta Rothman has tried to reach Dr. Robinson.

## 2024-09-29 NOTE — Progress Notes (Signed)
 " [facility] Gastroenterology        GI Clinic Note  Dear Dr. Debrah   Thank you for asking us  to see your patient Allison Randall for an outpatient consultation with need for [facility] screening. In the course of this evaluation, medical records from your office and data on [place] were reviewed and are incorporated into the H&P, assessment, and recommendations which follow below.  Subjective:  HPI: Allison Randall is a 73 y.o. female being seen in clinic for consultation with need for CRC screening and positive Cologuard by PCP. History is provided by the patient.   Patient states they are doing well overall. Patient states they are having 1-2 bowel movements daily, bristol 4-7. Patient denies constipation, diarrhea, abdominal pain, hematochezia, melena, steatorrhea, fevers, chills, night sweats or unintentional weight loss. Patient denies prior history of colonoscopy. Patient denies family history of CRC or IBD. Patient denies hx of hematological disorders or current use of anticoagulation agents. Patient reports rare use of NSAIDs. Denies tobacco use. Denies alcohol use. She is s/p cholecystectomy.  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      History:  Past Medical History: Medical History[1]  Past Surgical History: Surgical History[2]   Social History: Social History[3]  Family History: family history includes Heart disease in her mother; Prostate cancer in her father; Stroke in her mother.  Medications: Medications Ordered Prior to Encounter[4]   Allergies: Allergies[5]  Physical Exam:  Body mass index is 24.94 kg/m. Blood pressure 145/76, pulse (!) 115, weight 65.9 kg (145 lb 4.8 oz), SpO2 99%.  General:  Patient appears stated age; alert and oriented x 3, well nourished, well developed in no acute distress. Skin: Skin color and turgor are normal; there are no rashes, lesions or jaundice noted HEENT: Normocephalic, atraumatic,  without obvious abnormality. Sclera are not icteric. Mucous membranes are moist without ulcerations or discoloration.  Neck: Supple, trachea midline, no thyroid enlargement Lymphatics: No cervical or supraclavicular adenopathy Heart: Regular rate and rhythm, no murmurs, rubs or gallops.  Extremities: warm and well-perfused, no clubbing, cyanosis, or edema noted Lungs:  Clear to auscultation bilaterally with no wheezes, rales, or rhonchi and no respiratory distress Abdomen:  Soft, nondistended, nontender, normoactive bowel sounds; no evident mass, splenomegaly, ascites or hernias. Neuro: Alert and oriented; motor and sensory function grossly intact; no tremor Psychiatric: interactive; appropriate affect; voices no depressive ideation    Assessment:  Allison Randall is a 73 y.o. female who presents to clinic today for evaluation of positive cologuard and need for CRC screening. Patient denies any alarm symptoms. They deny any known family history of CRC. Prep and procedure are discussed with patient who expresses understanding and agreement. Risks, benefits and alternatives to plan are discussed. All questions are answered.    Visit Diagnoses:  1. Positive colorectal cancer screening using Cologuard test  Endo Colon       Plan:   Orders Placed This Encounter  Procedures   Endo Colon    - Colonoscopy ordered. Future diagnostic and therapeutic measures pending results.  - Recommend high fiber diet, adequate fluid intake and regular exercise for bowel regularity - Follow-up with PCP        [1] Past Medical History: Diagnosis Date   Acquired hypothyroidism 09/28/2016   Bilateral hearing loss 05/15/2021   Side effect of azithromycin .     Dermatitis due to drug reaction 12/22/2021   Frequent nosebleeds 03/16/2021   Hypertension 09/22/2016  Osteopenia 09/22/2016   Osteoporosis   [2] Past Surgical History: Procedure Laterality Date   BACK SURGERY     Procedure: BACK  SURGERY   BLEPHAROPLASTY     Procedure: BLEPHAROPLASTY   CHOLECYSTECTOMY     Procedure: CHOLECYSTECTOMY   DENTAL IMPLANT  2022   Procedure: DENTAL IMPLANT   OTHER SURGICAL HISTORY     Procedure: OTHER SURGICAL HISTORY (cataract surgery)   WISDOM TOOTH EXTRACTION     Procedure: WISDOM TOOTH EXTRACTION  [3] Social History Socioeconomic History   Marital status: Married  Tobacco Use   Smoking status: Former    Current packs/day: 1.00    Types: Cigarettes   Smokeless tobacco: Never  Vaping Use   Vaping status: Never Used  Substance and Sexual Activity   Alcohol use: Yes   Drug use: No   Social Drivers of Health   Living Situation: Low Risk (09/29/2024)   Living Situation    What is your living situation today?: I have a steady place to live    Think about the place you live. Do you have problems with any of the following? Choose all that apply:: None/None on this list  Food Insecurity: Low Risk (09/29/2024)   Food vital sign    Within the past 12 months, you worried that your food would run out before you got money to buy more: Never true    Within the past 12 months, the food you bought just didn't last and you didn't have money to get more: Never true  Transportation Needs: No Transportation Needs (09/29/2024)   Transportation    In the past 12 months, has lack of reliable transportation kept you from medical appointments, meetings, work or from getting things needed for daily living? : No  Utilities: Low Risk (09/29/2024)   Utilities    In the past 12 months has the electric, gas, oil, or water company threatened to shut off services in your home? : No  Safety: Low Risk (09/29/2024)   Safety    How often does anyone, including family and friends, physically hurt you?: Never    How often does anyone, including family and friends, insult or talk down to you?: Never    How often does anyone, including family and friends, threaten you with harm?: Never     How often does anyone, including family and friends, scream or curse at you?: Never  Alcohol Screening: Not At Risk (08/10/2024)   Alcohol    Audit C Alcohol risk score: 1  Tobacco Use: Medium Risk (09/29/2024)   Patient History    Smoking Tobacco Use: Former    Smokeless Tobacco Use: Never  Depression: Not At Risk (09/29/2024)   PHQ-2    PHQ-2 Score: 0  Social Connections: Moderately Integrated (07/29/2022)   Received from [facility] visits prior to 12/13/2022.   Social Connection and Isolation Panel    In a typical week, how many times do you talk on the phone with family, friends, or neighbors?: Three times a week    How often do you get together with friends or relatives?: Once a week    How often do you attend church or religious services?: 1 to 4 times per year    Do you belong to any clubs or organizations such as church groups, unions, fraternal or athletic groups, or school groups?: No    How often do you attend meetings of the clubs or organizations you belong to?: Patient refused  Are you married, widowed, divorced, separated, never married, or living with a partner?: Married  Programmer, Applications: Low Risk (07/29/2022)   Overall Financial Resource Strain (CARDIA)    Difficulty of Paying Living Expenses: Not hard at all  [4] Current Outpatient Medications on File Prior to Visit  Medication Sig Dispense Refill   alendronate (FOSAMAX) 70 mg tablet Take 1 tablet (70 mg total) by mouth once a week. In morning w/ full glass of water on an empty stomach,don't lie down x30 Min 12 tablet 3   atorvastatin  (LIPITOR) 20 mg tablet TAKE 1 TABLET BY MOUTH ONCE DAILY TO CONTROL CHOLESTEROL 90 tablet 3   azithromycin  (ZITHROMAX ) 250 mg tablet Take 250 mg by mouth daily. duration: 30 days     calcium -vitamin D3-vitamin K (Calcium  for Women) 500-100-40 mg-unit-mcg chew Take 2 tablets by mouth.     cyanocobalamin (VITAMIN B12) 2,500 mcg tab Take  by  mouth.     gabapentin (NEURONTIN) 300 mg capsule Take 1 or 2 capsules at bedtime for relief of restless legs. 180 capsule 2   krill-omega-3-dha-epa-lipids (krill oil) 300-90-24-50 mg cap Orally     Lactobacillus acidophilus (Probiotic) 10 billion cell cap Take by mouth.     levothyroxine  (SYNTHROID ) 100 mcg tablet TAKE 1 TABLET BY MOUTH ONCE DAILY ON AN EMPTY STOMACH FOR  THYROID  FUNCTION 90 tablet 3   losartan -hydroCHLOROthiazide  (HYZAAR) 100-12.5 mg per tablet TAKE 1 TABLET BY MOUTH ONCE DAILY FOR CONTROL OF BLOOD PRESSURE 90 tablet 3   lysine 1,000 mg tab Take  by mouth.     methocarbamoL  (ROBAXIN ) 500 mg tablet She takes 1/2 tablet as needed for head pain.  Written by Dr. Colon. 40 tablet 0   multivitamin,tx-minerals (Super Thera Vite M) tab Take 1 tablet by mouth.     Restasis  0.05 % ophthalmic emulsion Administer 1 drop into each eyes every 12 (twelve) hours. 30 vial    STUDY clofazimine  - single patient IND capsules 50 mg Take 50 mg by mouth daily     traMADoL (ULTRAM) 50 mg tablet Take 1 tablet (50 mg total) by mouth 3 (three) times a day as needed for moderate pain (4-6) (headache). 20 tablet 5   valACYclovir (VALTREX) 1 gram tablet Indications: herpes simplex infection. Take 2 tablets at first sign of fever blister and repeat in 12 hours 12 tablet 3   zolpidem  (AMBIEN ) 10 mg tablet TAKE 1/2 TO 1 (ONE-HALF TO ONE) TABLET BY MOUTH AT BEDTIME AS NEEDED FOR INSOMNIA 30 tablet 5   No current facility-administered medications on file prior to visit.  [5] Allergies Allergen Reactions   Amoxicillin-Pot Clavulanate Cough    Redness on face chest and arms   Redness on face chest and arms   Azithromycin  Other (See Comments)    Hearing loss.  "

## 2024-10-04 NOTE — Telephone Encounter (Signed)
 Spoke with patient and she was advised of information from Dr. Robinson and will continue abx. She will be coming by Monday to pick clofazimine .  Allison Randall Allison Randall, CMA

## 2024-10-04 NOTE — Telephone Encounter (Signed)
 Received vm from Dr. Robinson stating no cultures were done with pathology.

## 2024-10-04 NOTE — Telephone Encounter (Signed)
 Left voicemail with Dr. Shawnie office.

## 2024-10-11 ENCOUNTER — Other Ambulatory Visit (HOSPITAL_COMMUNITY): Payer: Self-pay

## 2024-10-11 ENCOUNTER — Telehealth: Payer: Self-pay | Admitting: Pharmacist

## 2024-10-11 NOTE — Telephone Encounter (Signed)
 Patient picked up 2 bottles of clofazimine  today. Supply should last for ~3 months. Next refill due around end of March/early April.  Allison Randall, PharmD, BCIDP, AAHIVP, CPP Clinical Pharmacist Practitioner Infectious Diseases Clinical Pharmacist [facility] 10/11/2024, 2:42 PM

## 2024-11-04 ENCOUNTER — Encounter: Payer: Self-pay | Admitting: Infectious Disease

## 2024-11-08 ENCOUNTER — Other Ambulatory Visit: Payer: Self-pay | Admitting: Family Medicine

## 2024-11-08 DIAGNOSIS — Z1231 Encounter for screening mammogram for malignant neoplasm of breast: Secondary | ICD-10-CM

## 2024-12-22 ENCOUNTER — Ambulatory Visit

## 2024-12-26 ENCOUNTER — Ambulatory Visit: Payer: Self-pay | Admitting: Infectious Disease

## 2025-02-01 ENCOUNTER — Ambulatory Visit: Admitting: Audiologist
